# Patient Record
Sex: Male | Born: 1965 | Race: Black or African American | Hispanic: No | Marital: Married | Smoking: Never smoker
Health system: Southern US, Community
[De-identification: ages and names within clinical notes are randomized; demographics above are authoritative.]

## PROBLEM LIST (undated history)

## (undated) DIAGNOSIS — N189 Chronic kidney disease, unspecified: Secondary | ICD-10-CM

## (undated) DIAGNOSIS — M3214 Glomerular disease in systemic lupus erythematosus: Secondary | ICD-10-CM

## (undated) DIAGNOSIS — N186 End stage renal disease: Secondary | ICD-10-CM

## (undated) DIAGNOSIS — E785 Hyperlipidemia, unspecified: Secondary | ICD-10-CM

## (undated) DIAGNOSIS — D649 Anemia, unspecified: Secondary | ICD-10-CM

## (undated) DIAGNOSIS — M109 Gout, unspecified: Secondary | ICD-10-CM

## (undated) DIAGNOSIS — E875 Hyperkalemia: Secondary | ICD-10-CM

## (undated) DIAGNOSIS — D693 Immune thrombocytopenic purpura: Secondary | ICD-10-CM

## (undated) DIAGNOSIS — I10 Essential (primary) hypertension: Secondary | ICD-10-CM

## (undated) DIAGNOSIS — M329 Systemic lupus erythematosus, unspecified: Secondary | ICD-10-CM

## (undated) HISTORY — DX: Anemia, unspecified: D64.9

## (undated) HISTORY — DX: End stage renal disease: N18.6

## (undated) HISTORY — DX: Hyperlipidemia, unspecified: E78.5

## (undated) HISTORY — DX: Immune thrombocytopenic purpura: D69.3

## (undated) HISTORY — DX: Hyperkalemia: E87.5

## (undated) HISTORY — DX: Chronic kidney disease, unspecified: N18.9

## (undated) HISTORY — DX: Systemic lupus erythematosus, unspecified: M32.9

## (undated) HISTORY — DX: Essential (primary) hypertension: I10

## (undated) HISTORY — DX: Glomerular disease in systemic lupus erythematosus: M32.14

## (undated) HISTORY — PX: PARATHYROIDECTOMY: SHX19

## (undated) HISTORY — DX: Gout, unspecified: M10.9

---

## 1999-05-13 ENCOUNTER — Encounter: Payer: Self-pay | Admitting: Nephrology

## 1999-05-13 ENCOUNTER — Ambulatory Visit (HOSPITAL_COMMUNITY): Admission: RE | Admit: 1999-05-13 | Discharge: 1999-05-14 | Payer: Self-pay | Admitting: Nephrology

## 1999-05-15 ENCOUNTER — Emergency Department (HOSPITAL_COMMUNITY): Admission: EM | Admit: 1999-05-15 | Discharge: 1999-05-15 | Payer: Self-pay | Admitting: Emergency Medicine

## 1999-12-20 ENCOUNTER — Observation Stay (HOSPITAL_COMMUNITY): Admission: AD | Admit: 1999-12-20 | Discharge: 1999-12-21 | Payer: Self-pay

## 1999-12-30 ENCOUNTER — Ambulatory Visit (HOSPITAL_COMMUNITY): Admission: RE | Admit: 1999-12-30 | Discharge: 1999-12-30 | Payer: Self-pay | Admitting: *Deleted

## 2000-01-30 ENCOUNTER — Encounter (HOSPITAL_COMMUNITY): Admission: RE | Admit: 2000-01-30 | Discharge: 2000-04-29 | Payer: Self-pay | Admitting: *Deleted

## 2000-02-14 ENCOUNTER — Encounter: Admission: RE | Admit: 2000-02-14 | Discharge: 2000-02-14 | Payer: Self-pay | Admitting: Internal Medicine

## 2000-02-17 ENCOUNTER — Encounter: Admission: RE | Admit: 2000-02-17 | Discharge: 2000-02-17 | Payer: Self-pay | Admitting: Internal Medicine

## 2000-02-17 ENCOUNTER — Encounter: Payer: Self-pay | Admitting: Internal Medicine

## 2000-04-06 ENCOUNTER — Encounter (HOSPITAL_COMMUNITY): Admission: RE | Admit: 2000-04-06 | Discharge: 2000-07-05 | Payer: Self-pay | Admitting: Nephrology

## 2000-07-06 ENCOUNTER — Ambulatory Visit (HOSPITAL_COMMUNITY): Admission: RE | Admit: 2000-07-06 | Discharge: 2000-07-06 | Payer: Self-pay | Admitting: *Deleted

## 2000-07-06 ENCOUNTER — Encounter (INDEPENDENT_AMBULATORY_CARE_PROVIDER_SITE_OTHER): Payer: Self-pay | Admitting: *Deleted

## 2000-08-10 ENCOUNTER — Ambulatory Visit (HOSPITAL_COMMUNITY): Admission: RE | Admit: 2000-08-10 | Discharge: 2000-08-11 | Payer: Self-pay | Admitting: Nephrology

## 2000-08-10 ENCOUNTER — Encounter (INDEPENDENT_AMBULATORY_CARE_PROVIDER_SITE_OTHER): Payer: Self-pay | Admitting: *Deleted

## 2000-08-10 ENCOUNTER — Encounter: Payer: Self-pay | Admitting: Nephrology

## 2000-08-17 ENCOUNTER — Encounter (HOSPITAL_COMMUNITY): Admission: RE | Admit: 2000-08-17 | Discharge: 2000-11-15 | Payer: Self-pay | Admitting: Nephrology

## 2000-09-28 ENCOUNTER — Encounter: Payer: Self-pay | Admitting: *Deleted

## 2000-09-28 ENCOUNTER — Ambulatory Visit (HOSPITAL_COMMUNITY): Admission: RE | Admit: 2000-09-28 | Discharge: 2000-09-28 | Payer: Self-pay | Admitting: *Deleted

## 2002-05-09 ENCOUNTER — Ambulatory Visit (HOSPITAL_COMMUNITY): Admission: RE | Admit: 2002-05-09 | Discharge: 2002-05-09 | Payer: Self-pay | Admitting: Emergency Medicine

## 2002-05-09 ENCOUNTER — Encounter: Payer: Self-pay | Admitting: Emergency Medicine

## 2004-09-26 ENCOUNTER — Ambulatory Visit: Payer: Self-pay | Admitting: Hematology & Oncology

## 2004-12-25 ENCOUNTER — Ambulatory Visit: Payer: Self-pay | Admitting: Hematology & Oncology

## 2005-01-17 ENCOUNTER — Ambulatory Visit: Payer: Self-pay | Admitting: Internal Medicine

## 2005-01-27 ENCOUNTER — Ambulatory Visit: Payer: Self-pay | Admitting: Internal Medicine

## 2005-03-26 ENCOUNTER — Ambulatory Visit: Payer: Self-pay | Admitting: Hematology & Oncology

## 2005-07-04 ENCOUNTER — Ambulatory Visit: Payer: Self-pay | Admitting: Hematology & Oncology

## 2005-10-09 ENCOUNTER — Emergency Department (HOSPITAL_COMMUNITY): Admission: EM | Admit: 2005-10-09 | Discharge: 2005-10-09 | Payer: Self-pay | Admitting: Family Medicine

## 2006-01-23 ENCOUNTER — Ambulatory Visit: Payer: Self-pay | Admitting: Hematology & Oncology

## 2006-01-29 ENCOUNTER — Ambulatory Visit: Payer: Self-pay | Admitting: Internal Medicine

## 2006-02-06 ENCOUNTER — Ambulatory Visit: Payer: Self-pay | Admitting: Internal Medicine

## 2006-05-01 ENCOUNTER — Ambulatory Visit: Payer: Self-pay | Admitting: Internal Medicine

## 2006-08-26 ENCOUNTER — Ambulatory Visit: Payer: Self-pay | Admitting: Hematology & Oncology

## 2006-08-28 LAB — CBC WITH DIFFERENTIAL/PLATELET
BASO%: 0.3 % (ref 0.0–2.0)
EOS%: 5.9 % (ref 0.0–7.0)
LYMPH%: 21.9 % (ref 14.0–48.0)
MCHC: 33.6 g/dL (ref 32.0–35.9)
MONO#: 0.6 10*3/uL (ref 0.1–0.9)
Platelets: 262 10*3/uL (ref 145–400)
RBC: 3.74 10*6/uL — ABNORMAL LOW (ref 4.20–5.71)
WBC: 5.6 10*3/uL (ref 4.0–10.0)

## 2006-08-28 LAB — COMPREHENSIVE METABOLIC PANEL
ALT: 10 U/L (ref 0–53)
AST: 13 U/L (ref 0–37)
Alkaline Phosphatase: 79 U/L (ref 39–117)
Calcium: 9 mg/dL (ref 8.4–10.5)
Chloride: 110 mEq/L (ref 96–112)
Creatinine, Ser: 1.92 mg/dL — ABNORMAL HIGH (ref 0.40–1.50)
Total Bilirubin: 0.4 mg/dL (ref 0.3–1.2)

## 2006-08-28 LAB — CHCC SMEAR

## 2006-09-30 ENCOUNTER — Ambulatory Visit: Payer: Self-pay | Admitting: Hematology & Oncology

## 2006-09-30 LAB — CBC WITH DIFFERENTIAL/PLATELET
BASO%: 0.1 % (ref 0.0–2.0)
LYMPH%: 20 % (ref 14.0–48.0)
MCHC: 32.8 g/dL (ref 32.0–35.9)
MCV: 87.9 fL (ref 81.6–98.0)
MONO%: 11.2 % (ref 0.0–13.0)
Platelets: 260 10*3/uL (ref 145–400)
RBC: 4.12 10*6/uL — ABNORMAL LOW (ref 4.20–5.71)

## 2006-11-04 ENCOUNTER — Ambulatory Visit: Payer: Self-pay | Admitting: Hematology & Oncology

## 2006-11-04 LAB — CBC WITH DIFFERENTIAL/PLATELET
BASO%: 1.5 % (ref 0.0–2.0)
Basophils Absolute: 0.1 10*3/uL (ref 0.0–0.1)
EOS%: 6.3 % (ref 0.0–7.0)
HCT: 39 % (ref 38.7–49.9)
LYMPH%: 19.6 % (ref 14.0–48.0)
MCH: 28.3 pg (ref 28.0–33.4)
MCHC: 32.2 g/dL (ref 32.0–35.9)
MCV: 88 fL (ref 81.6–98.0)
MONO%: 10.7 % (ref 0.0–13.0)
NEUT%: 62 % (ref 40.0–75.0)
Platelets: 241 10*3/uL (ref 145–400)

## 2006-12-02 LAB — CBC WITH DIFFERENTIAL/PLATELET
BASO%: 0.3 % (ref 0.0–2.0)
Basophils Absolute: 0 10*3/uL (ref 0.0–0.1)
EOS%: 4.5 % (ref 0.0–7.0)
Eosinophils Absolute: 0.3 10*3/uL (ref 0.0–0.5)
HCT: 34.5 % — ABNORMAL LOW (ref 38.7–49.9)
HGB: 11.7 g/dL — ABNORMAL LOW (ref 13.0–17.1)
LYMPH%: 20.2 % (ref 14.0–48.0)
MCH: 28.9 pg (ref 28.0–33.4)
MCHC: 33.8 g/dL (ref 32.0–35.9)
MCV: 85.4 fL (ref 81.6–98.0)
MONO#: 0.7 10*3/uL (ref 0.1–0.9)
MONO%: 10.7 % (ref 0.0–13.0)
NEUT#: 4 10*3/uL (ref 1.5–6.5)
NEUT%: 64.3 % (ref 40.0–75.0)
Platelets: 189 10*3/uL (ref 145–400)
RBC: 4.04 10*6/uL — ABNORMAL LOW (ref 4.20–5.71)
RDW: 15.2 % — ABNORMAL HIGH (ref 11.2–14.6)
WBC: 6.3 10*3/uL (ref 4.0–10.0)
lymph#: 1.3 10*3/uL (ref 0.9–3.3)

## 2006-12-25 ENCOUNTER — Ambulatory Visit: Payer: Self-pay | Admitting: Hematology & Oncology

## 2006-12-30 LAB — CBC WITH DIFFERENTIAL/PLATELET
Basophils Absolute: 0 10*3/uL (ref 0.0–0.1)
EOS%: 2.7 % (ref 0.0–7.0)
HCT: 34.3 % — ABNORMAL LOW (ref 38.7–49.9)
HGB: 11.3 g/dL — ABNORMAL LOW (ref 13.0–17.1)
MCH: 28.3 pg (ref 28.0–33.4)
MCV: 85.4 fL (ref 81.6–98.0)
MONO%: 11.8 % (ref 0.0–13.0)
NEUT%: 61 % (ref 40.0–75.0)

## 2007-01-27 LAB — CBC WITH DIFFERENTIAL/PLATELET
Basophils Absolute: 0 10*3/uL (ref 0.0–0.1)
EOS%: 3.7 % (ref 0.0–7.0)
MCH: 28.7 pg (ref 28.0–33.4)
MCV: 85.8 fL (ref 81.6–98.0)
MONO%: 10.7 % (ref 0.0–13.0)
RBC: 4.18 10*6/uL — ABNORMAL LOW (ref 4.20–5.71)
RDW: 15.3 % — ABNORMAL HIGH (ref 11.2–14.6)

## 2007-02-19 ENCOUNTER — Ambulatory Visit: Payer: Self-pay | Admitting: Hematology & Oncology

## 2007-02-24 LAB — CBC WITH DIFFERENTIAL/PLATELET
BASO%: 1 % (ref 0.0–2.0)
Eosinophils Absolute: 0.2 10*3/uL (ref 0.0–0.5)
MCHC: 32.5 g/dL (ref 32.0–35.9)
MCV: 87.8 fL (ref 81.6–98.0)
MONO%: 10.2 % (ref 0.0–13.0)
NEUT#: 4 10*3/uL (ref 1.5–6.5)
RBC: 3.95 10*6/uL — ABNORMAL LOW (ref 4.20–5.71)
RDW: 13.3 % (ref 11.2–14.6)
WBC: 6 10*3/uL (ref 4.0–10.0)

## 2007-03-24 LAB — CBC WITH DIFFERENTIAL/PLATELET
BASO%: 0.3 % (ref 0.0–2.0)
Eosinophils Absolute: 0.4 10*3/uL (ref 0.0–0.5)
LYMPH%: 15.1 % (ref 14.0–48.0)
MONO#: 0.8 10*3/uL (ref 0.1–0.9)
NEUT#: 5.1 10*3/uL (ref 1.5–6.5)
Platelets: 243 10*3/uL (ref 145–400)
RBC: 4 10*6/uL — ABNORMAL LOW (ref 4.20–5.71)
RDW: 14.4 % (ref 11.2–14.6)
WBC: 7.5 10*3/uL (ref 4.0–10.0)
lymph#: 1.1 10*3/uL (ref 0.9–3.3)

## 2007-03-26 LAB — FERRITIN: Ferritin: 337 ng/mL — ABNORMAL HIGH (ref 22–322)

## 2007-03-26 LAB — TRANSFERRIN RECEPTOR, SOLUABLE: Transferrin Receptor, Soluble: 46.3 nmol/L

## 2007-04-01 ENCOUNTER — Emergency Department (HOSPITAL_COMMUNITY): Admission: EM | Admit: 2007-04-01 | Discharge: 2007-04-01 | Payer: Self-pay | Admitting: Emergency Medicine

## 2007-04-16 ENCOUNTER — Ambulatory Visit: Payer: Self-pay | Admitting: Hematology & Oncology

## 2007-04-22 LAB — CBC WITH DIFFERENTIAL/PLATELET
BASO%: 0.4 % (ref 0.0–2.0)
MCHC: 34.1 g/dL (ref 32.0–35.9)
MONO#: 0.8 10*3/uL (ref 0.1–0.9)
RBC: 3.65 10*6/uL — ABNORMAL LOW (ref 4.20–5.71)
RDW: 14.6 % (ref 11.2–14.6)
WBC: 6.2 10*3/uL (ref 4.0–10.0)
lymph#: 1.4 10*3/uL (ref 0.9–3.3)

## 2007-04-28 ENCOUNTER — Ambulatory Visit: Payer: Self-pay | Admitting: Internal Medicine

## 2007-04-28 LAB — CONVERTED CEMR LAB
ALT: 22 units/L (ref 0–53)
AST: 16 units/L (ref 0–37)
Albumin: 3.7 g/dL (ref 3.5–5.2)
Alkaline Phosphatase: 57 units/L (ref 39–117)
BUN: 50 mg/dL — ABNORMAL HIGH (ref 6–23)
Basophils Absolute: 0 10*3/uL (ref 0.0–0.1)
Basophils Relative: 0 % (ref 0.0–1.0)
Bilirubin Urine: NEGATIVE
Bilirubin, Direct: 0.1 mg/dL (ref 0.0–0.3)
CO2: 21 meq/L (ref 19–32)
Calcium: 9.1 mg/dL (ref 8.4–10.5)
Chloride: 118 meq/L — ABNORMAL HIGH (ref 96–112)
Cholesterol: 231 mg/dL (ref 0–200)
Creatinine, Ser: 2.3 mg/dL — ABNORMAL HIGH (ref 0.4–1.5)
Direct LDL: 122.3 mg/dL
Eosinophils Absolute: 0.5 10*3/uL (ref 0.0–0.6)
Eosinophils Relative: 5.5 % — ABNORMAL HIGH (ref 0.0–5.0)
GFR calc Af Amer: 41 mL/min
GFR calc non Af Amer: 34 mL/min
Glucose, Bld: 99 mg/dL (ref 70–99)
HCT: 34.3 % — ABNORMAL LOW (ref 39.0–52.0)
HDL: 37.4 mg/dL — ABNORMAL LOW (ref >39.0)
Hemoglobin: 11.2 g/dL — ABNORMAL LOW (ref 13.0–17.0)
Ketones, ur: NEGATIVE mg/dL
Leukocytes, UA: NEGATIVE
Lymphocytes Relative: 16.4 % (ref 12.0–46.0)
MCHC: 32.7 g/dL (ref 30.0–36.0)
MCV: 86.4 fL (ref 78.0–100.0)
Monocytes Absolute: 0.9 10*3/uL — ABNORMAL HIGH (ref 0.2–0.7)
Monocytes Relative: 10.8 % (ref 3.0–11.0)
Mucus, UA: NEGATIVE
Neutro Abs: 5.9 10*3/uL (ref 1.4–7.7)
Neutrophils Relative %: 67.3 % (ref 43.0–77.0)
Nitrite: NEGATIVE
PSA: 1.04 ng/mL (ref 0.10–4.00)
Platelets: 215 10*3/uL (ref 150–400)
Potassium: 5.4 meq/L — ABNORMAL HIGH (ref 3.5–5.1)
RBC: 3.96 M/uL — ABNORMAL LOW (ref 4.22–5.81)
RDW: 14.9 % — ABNORMAL HIGH (ref 11.5–14.6)
Sodium: 142 meq/L (ref 135–145)
Specific Gravity, Urine: 1.025 (ref 1.000–1.03)
Squamous Epithelial / HPF: NEGATIVE /lpf
TSH: 2.18 microintl units/mL (ref 0.35–5.50)
Total Bilirubin: 0.9 mg/dL (ref 0.3–1.2)
Total CHOL/HDL Ratio: 6.2
Total Protein, Urine: 100 mg/dL — AB
Total Protein: 7.2 g/dL (ref 6.0–8.3)
Triglycerides: 238 mg/dL (ref 0–149)
Urine Glucose: NEGATIVE mg/dL
Urobilinogen, UA: 0.2 (ref 0.0–1.0)
VLDL: 48 mg/dL — ABNORMAL HIGH (ref 0–40)
WBC: 8.7 10*3/uL (ref 4.5–10.5)
pH: 6 (ref 5.0–8.0)

## 2007-05-05 ENCOUNTER — Ambulatory Visit: Payer: Self-pay | Admitting: Internal Medicine

## 2007-06-11 ENCOUNTER — Ambulatory Visit: Payer: Self-pay | Admitting: Hematology & Oncology

## 2007-06-16 LAB — CBC WITH DIFFERENTIAL/PLATELET
BASO%: 0.9 % (ref 0.0–2.0)
Basophils Absolute: 0.1 10*3/uL (ref 0.0–0.1)
HCT: 31.9 % — ABNORMAL LOW (ref 38.7–49.9)
HGB: 10.6 g/dL — ABNORMAL LOW (ref 13.0–17.1)
MCHC: 33.3 g/dL (ref 32.0–35.9)
MONO#: 1.1 10*3/uL — ABNORMAL HIGH (ref 0.1–0.9)
NEUT%: 71.1 % (ref 40.0–75.0)
WBC: 11 10*3/uL — ABNORMAL HIGH (ref 4.0–10.0)
lymph#: 1.5 10*3/uL (ref 0.9–3.3)

## 2007-07-13 LAB — CBC WITH DIFFERENTIAL/PLATELET
Basophils Absolute: 0 10*3/uL (ref 0.0–0.1)
EOS%: 8.3 % — ABNORMAL HIGH (ref 0.0–7.0)
Eosinophils Absolute: 0.4 10*3/uL (ref 0.0–0.5)
HCT: 32 % — ABNORMAL LOW (ref 38.7–49.9)
HGB: 10.8 g/dL — ABNORMAL LOW (ref 13.0–17.1)
MCH: 29.5 pg (ref 28.0–33.4)
NEUT%: 58.4 % (ref 40.0–75.0)
lymph#: 1.1 10*3/uL (ref 0.9–3.3)

## 2007-08-06 ENCOUNTER — Ambulatory Visit: Payer: Self-pay | Admitting: Hematology & Oncology

## 2007-08-10 LAB — CBC WITH DIFFERENTIAL/PLATELET
Basophils Absolute: 0.1 10*3/uL (ref 0.0–0.1)
EOS%: 5.1 % (ref 0.0–7.0)
HCT: 32.8 % — ABNORMAL LOW (ref 38.7–49.9)
HGB: 10.7 g/dL — ABNORMAL LOW (ref 13.0–17.1)
LYMPH%: 20.6 % (ref 14.0–48.0)
MCH: 29 pg (ref 28.0–33.4)
MCV: 89.3 fL (ref 81.6–98.0)
MONO%: 9 % (ref 0.0–13.0)
NEUT%: 64 % (ref 40.0–75.0)
Platelets: 181 10*3/uL (ref 145–400)

## 2007-09-26 ENCOUNTER — Emergency Department (HOSPITAL_COMMUNITY): Admission: EM | Admit: 2007-09-26 | Discharge: 2007-09-26 | Payer: Self-pay | Admitting: Family Medicine

## 2007-10-14 ENCOUNTER — Ambulatory Visit: Payer: Self-pay | Admitting: Hematology & Oncology

## 2007-10-18 ENCOUNTER — Encounter: Payer: Self-pay | Admitting: Internal Medicine

## 2007-10-18 LAB — CBC & DIFF AND RETIC
BASO%: 1.6 % (ref 0.0–2.0)
Basophils Absolute: 0.1 10*3/uL (ref 0.0–0.1)
EOS%: 3.2 % (ref 0.0–7.0)
HCT: 32.2 % — ABNORMAL LOW (ref 38.7–49.9)
HGB: 10.6 g/dL — ABNORMAL LOW (ref 13.0–17.1)
IRF: 0.32 (ref 0.070–0.380)
LYMPH%: 18.7 % (ref 14.0–48.0)
MCH: 29.2 pg (ref 28.0–33.4)
MCHC: 32.8 g/dL (ref 32.0–35.9)
NEUT%: 68.6 % (ref 40.0–75.0)
Platelets: 215 10*3/uL (ref 145–400)
lymph#: 1.4 10*3/uL (ref 0.9–3.3)

## 2007-10-18 LAB — FERRITIN: Ferritin: 325 ng/mL — ABNORMAL HIGH (ref 22–322)

## 2007-11-22 ENCOUNTER — Encounter: Payer: Self-pay | Admitting: Internal Medicine

## 2007-11-23 LAB — CBC WITH DIFFERENTIAL/PLATELET
Basophils Absolute: 0 10*3/uL (ref 0.0–0.1)
EOS%: 3 % (ref 0.0–7.0)
Eosinophils Absolute: 0.2 10*3/uL (ref 0.0–0.5)
HCT: 34.7 % — ABNORMAL LOW (ref 38.7–49.9)
HGB: 11.3 g/dL — ABNORMAL LOW (ref 13.0–17.1)
MCH: 29.5 pg (ref 28.0–33.4)
MCV: 90.5 fL (ref 81.6–98.0)
NEUT#: 5.4 10*3/uL (ref 1.5–6.5)
NEUT%: 68.8 % (ref 40.0–75.0)
RDW: 16 % — ABNORMAL HIGH (ref 11.2–14.6)
lymph#: 1.4 10*3/uL (ref 0.9–3.3)

## 2007-12-16 ENCOUNTER — Ambulatory Visit: Payer: Self-pay | Admitting: Hematology & Oncology

## 2007-12-21 LAB — CBC WITH DIFFERENTIAL/PLATELET
Eosinophils Absolute: 0.3 10*3/uL (ref 0.0–0.5)
LYMPH%: 8.8 % — ABNORMAL LOW (ref 14.0–48.0)
MONO#: 1 10*3/uL — ABNORMAL HIGH (ref 0.1–0.9)
NEUT#: 9.3 10*3/uL — ABNORMAL HIGH (ref 1.5–6.5)
Platelets: 172 10*3/uL (ref 145–400)
RBC: 4.13 10*6/uL — ABNORMAL LOW (ref 4.20–5.71)
WBC: 11.8 10*3/uL — ABNORMAL HIGH (ref 4.0–10.0)
lymph#: 1 10*3/uL (ref 0.9–3.3)

## 2008-02-21 ENCOUNTER — Ambulatory Visit: Payer: Self-pay | Admitting: Hematology & Oncology

## 2008-02-22 ENCOUNTER — Encounter: Payer: Self-pay | Admitting: *Deleted

## 2008-02-22 DIAGNOSIS — M329 Systemic lupus erythematosus, unspecified: Secondary | ICD-10-CM | POA: Insufficient documentation

## 2008-02-22 DIAGNOSIS — M109 Gout, unspecified: Secondary | ICD-10-CM | POA: Insufficient documentation

## 2008-02-22 DIAGNOSIS — E785 Hyperlipidemia, unspecified: Secondary | ICD-10-CM | POA: Insufficient documentation

## 2008-02-22 DIAGNOSIS — I129 Hypertensive chronic kidney disease with stage 1 through stage 4 chronic kidney disease, or unspecified chronic kidney disease: Secondary | ICD-10-CM | POA: Insufficient documentation

## 2008-02-22 DIAGNOSIS — N189 Chronic kidney disease, unspecified: Secondary | ICD-10-CM | POA: Insufficient documentation

## 2008-02-22 DIAGNOSIS — E875 Hyperkalemia: Secondary | ICD-10-CM | POA: Insufficient documentation

## 2008-02-23 ENCOUNTER — Encounter: Payer: Self-pay | Admitting: Internal Medicine

## 2008-02-23 LAB — CBC WITH DIFFERENTIAL/PLATELET
Basophils Absolute: 0.1 10*3/uL (ref 0.0–0.1)
EOS%: 5.5 % (ref 0.0–7.0)
Eosinophils Absolute: 0.4 10*3/uL (ref 0.0–0.5)
HGB: 11.6 g/dL — ABNORMAL LOW (ref 13.0–17.1)
MCH: 30 pg (ref 28.0–33.4)
NEUT#: 4.9 10*3/uL (ref 1.5–6.5)
RDW: 16.6 % — ABNORMAL HIGH (ref 11.2–14.6)
lymph#: 1.4 10*3/uL (ref 0.9–3.3)

## 2008-02-26 DIAGNOSIS — D649 Anemia, unspecified: Secondary | ICD-10-CM | POA: Insufficient documentation

## 2008-02-26 DIAGNOSIS — D693 Immune thrombocytopenic purpura: Secondary | ICD-10-CM | POA: Insufficient documentation

## 2008-03-23 ENCOUNTER — Ambulatory Visit: Payer: Self-pay | Admitting: Hematology & Oncology

## 2008-03-23 LAB — CBC WITH DIFFERENTIAL/PLATELET
BASO%: 0.2 % (ref 0.0–2.0)
EOS%: 8.5 % — ABNORMAL HIGH (ref 0.0–7.0)
MCH: 30.2 pg (ref 28.0–33.4)
MCHC: 33.7 g/dL (ref 32.0–35.9)
RBC: 3.46 10*6/uL — ABNORMAL LOW (ref 4.20–5.71)
RDW: 16.6 % — ABNORMAL HIGH (ref 11.2–14.6)
lymph#: 1.3 10*3/uL (ref 0.9–3.3)

## 2008-04-02 ENCOUNTER — Emergency Department (HOSPITAL_COMMUNITY): Admission: EM | Admit: 2008-04-02 | Discharge: 2008-04-02 | Payer: Self-pay | Admitting: Family Medicine

## 2008-04-13 ENCOUNTER — Encounter: Payer: Self-pay | Admitting: Internal Medicine

## 2008-04-24 ENCOUNTER — Encounter: Payer: Self-pay | Admitting: Internal Medicine

## 2008-04-24 LAB — FERRITIN: Ferritin: 1010 ng/mL — ABNORMAL HIGH (ref 22–322)

## 2008-04-24 LAB — CHCC SMEAR

## 2008-04-24 LAB — CBC WITH DIFFERENTIAL/PLATELET
Eosinophils Absolute: 0.5 10*3/uL (ref 0.0–0.5)
HCT: 31.3 % — ABNORMAL LOW (ref 38.7–49.9)
LYMPH%: 20.7 % (ref 14.0–48.0)
MONO#: 0.8 10*3/uL (ref 0.1–0.9)
NEUT#: 3.9 10*3/uL (ref 1.5–6.5)
NEUT%: 58.7 % (ref 40.0–75.0)
Platelets: 158 10*3/uL (ref 145–400)
RBC: 3.46 10*6/uL — ABNORMAL LOW (ref 4.20–5.71)
WBC: 6.6 10*3/uL (ref 4.0–10.0)

## 2008-05-02 ENCOUNTER — Ambulatory Visit: Payer: Self-pay | Admitting: Hematology & Oncology

## 2008-05-05 LAB — CBC WITH DIFFERENTIAL/PLATELET
BASO%: 0.5 % (ref 0.0–2.0)
HCT: 30.4 % — ABNORMAL LOW (ref 38.7–49.9)
MCHC: 33.6 g/dL (ref 32.0–35.9)
MONO#: 1 10*3/uL — ABNORMAL HIGH (ref 0.1–0.9)
RBC: 3.34 10*6/uL — ABNORMAL LOW (ref 4.20–5.71)
WBC: 9.4 10*3/uL (ref 4.0–10.0)
lymph#: 2 10*3/uL (ref 0.9–3.3)

## 2008-05-05 LAB — CHCC SMEAR

## 2008-05-08 ENCOUNTER — Ambulatory Visit: Payer: Self-pay | Admitting: Internal Medicine

## 2008-05-09 ENCOUNTER — Ambulatory Visit: Payer: Self-pay | Admitting: Internal Medicine

## 2008-05-09 LAB — CONVERTED CEMR LAB
ALT: 31 units/L (ref 0–53)
AST: 20 units/L (ref 0–37)
Albumin: 3.7 g/dL (ref 3.5–5.2)
Alkaline Phosphatase: 68 units/L (ref 39–117)
BUN: 55 mg/dL — ABNORMAL HIGH (ref 6–23)
Basophils Absolute: 0 10*3/uL (ref 0.0–0.1)
Basophils Relative: 0.1 % (ref 0.0–1.0)
Bilirubin Urine: NEGATIVE
Bilirubin, Direct: 0.1 mg/dL (ref 0.0–0.3)
CO2: 20 meq/L (ref 19–32)
Calcium: 9.4 mg/dL (ref 8.4–10.5)
Chloride: 116 meq/L — ABNORMAL HIGH (ref 96–112)
Cholesterol: 115 mg/dL (ref 0–200)
Creatinine, Ser: 2 mg/dL — ABNORMAL HIGH (ref 0.4–1.5)
Crystals: NEGATIVE
Eosinophils Absolute: 0.4 10*3/uL (ref 0.0–0.7)
Eosinophils Relative: 3.6 % (ref 0.0–5.0)
GFR calc Af Amer: 48 mL/min
GFR calc non Af Amer: 39 mL/min
Glucose, Bld: 92 mg/dL (ref 70–99)
HCT: 30.7 % — ABNORMAL LOW (ref 39.0–52.0)
HDL: 35 mg/dL — ABNORMAL LOW (ref >39.0)
Hemoglobin, Urine: NEGATIVE
Hemoglobin: 10.2 g/dL — ABNORMAL LOW (ref 13.0–17.0)
Ketones, ur: NEGATIVE mg/dL
LDL Cholesterol: 61 mg/dL (ref 0–99)
Leukocytes, UA: NEGATIVE
Lymphocytes Relative: 17.3 % (ref 12.0–46.0)
MCHC: 33.2 g/dL (ref 30.0–36.0)
MCV: 92.7 fL (ref 78.0–100.0)
Monocytes Absolute: 1 10*3/uL (ref 0.1–1.0)
Monocytes Relative: 9.7 % (ref 3.0–12.0)
Mucus, UA: NEGATIVE
Neutro Abs: 7 10*3/uL (ref 1.4–7.7)
Neutrophils Relative %: 69.3 % (ref 43.0–77.0)
Nitrite: NEGATIVE
Platelets: 149 10*3/uL — ABNORMAL LOW (ref 150–400)
Potassium: 4.8 meq/L (ref 3.5–5.1)
RBC: 3.31 M/uL — ABNORMAL LOW (ref 4.22–5.81)
RDW: 15.1 % — ABNORMAL HIGH (ref 11.5–14.6)
Sodium: 142 meq/L (ref 135–145)
Specific Gravity, Urine: 1.025 (ref 1.000–1.03)
TSH: 2.29 microintl units/mL (ref 0.35–5.50)
Total Bilirubin: 0.6 mg/dL (ref 0.3–1.2)
Total CHOL/HDL Ratio: 3.3
Total Protein, Urine: 30 mg/dL — AB
Total Protein: 7 g/dL (ref 6.0–8.3)
Triglycerides: 95 mg/dL (ref 0–149)
Urine Glucose: NEGATIVE mg/dL
Urobilinogen, UA: 0.2 (ref 0.0–1.0)
VLDL: 19 mg/dL (ref 0–40)
WBC: 10.1 10*3/uL (ref 4.5–10.5)
pH: 5.5 (ref 5.0–8.0)

## 2008-05-26 LAB — CBC WITH DIFFERENTIAL/PLATELET
Eosinophils Absolute: 0.3 10*3/uL (ref 0.0–0.5)
MONO#: 0.6 10*3/uL (ref 0.1–0.9)
NEUT#: 2.9 10*3/uL (ref 1.5–6.5)
RBC: 3.83 10*6/uL — ABNORMAL LOW (ref 4.20–5.71)
RDW: 14.8 % — ABNORMAL HIGH (ref 11.2–14.6)
WBC: 5.3 10*3/uL (ref 4.0–10.0)

## 2008-06-17 ENCOUNTER — Emergency Department (HOSPITAL_COMMUNITY): Admission: EM | Admit: 2008-06-17 | Discharge: 2008-06-17 | Payer: Self-pay | Admitting: Emergency Medicine

## 2008-07-13 ENCOUNTER — Ambulatory Visit: Payer: Self-pay | Admitting: Hematology & Oncology

## 2008-07-13 ENCOUNTER — Emergency Department (HOSPITAL_COMMUNITY): Admission: EM | Admit: 2008-07-13 | Discharge: 2008-07-13 | Payer: Self-pay | Admitting: Family Medicine

## 2008-07-14 ENCOUNTER — Ambulatory Visit: Payer: Self-pay | Admitting: Hematology & Oncology

## 2008-07-14 LAB — CBC WITH DIFFERENTIAL/PLATELET
Basophils Absolute: 0 10*3/uL (ref 0.0–0.1)
Eosinophils Absolute: 0.2 10*3/uL (ref 0.0–0.5)
HGB: 11 g/dL — ABNORMAL LOW (ref 13.0–17.1)
MCV: 91.2 fL (ref 81.6–98.0)
MONO#: 1.2 10*3/uL — ABNORMAL HIGH (ref 0.1–0.9)
MONO%: 10.1 % (ref 0.0–13.0)
NEUT#: 9.2 10*3/uL — ABNORMAL HIGH (ref 1.5–6.5)
RDW: 16.1 % — ABNORMAL HIGH (ref 11.2–14.6)

## 2008-07-14 LAB — FERRITIN: Ferritin: 697 ng/mL — ABNORMAL HIGH (ref 22–322)

## 2008-09-19 ENCOUNTER — Ambulatory Visit: Payer: Self-pay | Admitting: Hematology & Oncology

## 2008-09-22 LAB — CBC WITH DIFFERENTIAL/PLATELET
Basophils Absolute: 0 10*3/uL (ref 0.0–0.1)
EOS%: 3.7 % (ref 0.0–7.0)
Eosinophils Absolute: 0.3 10*3/uL (ref 0.0–0.5)
HGB: 9.7 g/dL — ABNORMAL LOW (ref 13.0–17.1)
NEUT#: 5.1 10*3/uL (ref 1.5–6.5)
RDW: 16.5 % — ABNORMAL HIGH (ref 11.2–14.6)
lymph#: 1.1 10*3/uL (ref 0.9–3.3)

## 2008-10-13 ENCOUNTER — Ambulatory Visit: Payer: Self-pay | Admitting: Hematology & Oncology

## 2008-10-16 ENCOUNTER — Encounter: Payer: Self-pay | Admitting: Internal Medicine

## 2008-10-17 ENCOUNTER — Encounter: Payer: Self-pay | Admitting: Internal Medicine

## 2008-11-29 ENCOUNTER — Ambulatory Visit: Payer: Self-pay | Admitting: Hematology & Oncology

## 2008-12-01 LAB — CBC WITH DIFFERENTIAL/PLATELET
BASO%: 0.5 % (ref 0.0–2.0)
EOS%: 3.3 % (ref 0.0–7.0)
MCH: 29.8 pg (ref 28.0–33.4)
MCHC: 33.5 g/dL (ref 32.0–35.9)
RDW: 15.8 % — ABNORMAL HIGH (ref 11.2–14.6)
lymph#: 1.3 10*3/uL (ref 0.9–3.3)

## 2008-12-28 LAB — CBC WITH DIFFERENTIAL/PLATELET
BASO%: 0.9 % (ref 0.0–2.0)
EOS%: 4.4 % (ref 0.0–7.0)
Eosinophils Absolute: 0.2 10*3/uL (ref 0.0–0.5)
MCHC: 31.9 g/dL — ABNORMAL LOW (ref 32.0–36.0)
MCV: 88.8 fL (ref 79.3–98.0)
MONO%: 12.9 % (ref 0.0–14.0)
NEUT#: 3 10*3/uL (ref 1.5–6.5)
RBC: 4.27 10*6/uL (ref 4.20–5.82)
RDW: 14.9 % — ABNORMAL HIGH (ref 11.0–14.6)
nRBC: 0 % (ref 0–0)

## 2009-01-24 ENCOUNTER — Emergency Department (HOSPITAL_COMMUNITY): Admission: EM | Admit: 2009-01-24 | Discharge: 2009-01-24 | Payer: Self-pay | Admitting: Family Medicine

## 2009-02-06 ENCOUNTER — Ambulatory Visit: Payer: Self-pay | Admitting: Hematology

## 2009-02-06 LAB — CBC & DIFF AND RETIC
BASO%: 0.5 % (ref 0.0–2.0)
Basophils Absolute: 0 10*3/uL (ref 0.0–0.1)
HCT: 32.6 % — ABNORMAL LOW (ref 38.4–49.9)
HGB: 10.9 g/dL — ABNORMAL LOW (ref 13.0–17.1)
IRF: 0.34 (ref 0.070–0.380)
MCHC: 33.4 g/dL (ref 32.0–36.0)
MONO#: 0.6 10*3/uL (ref 0.1–0.9)
NEUT%: 66.2 % (ref 39.0–75.0)
WBC: 7 10*3/uL (ref 4.0–10.3)
lymph#: 1.4 10*3/uL (ref 0.9–3.3)

## 2009-02-08 LAB — FERRITIN: Ferritin: 741 ng/mL — ABNORMAL HIGH (ref 22–322)

## 2009-03-01 ENCOUNTER — Ambulatory Visit: Payer: Self-pay | Admitting: Hematology & Oncology

## 2009-03-02 ENCOUNTER — Encounter: Payer: Self-pay | Admitting: Internal Medicine

## 2009-03-02 LAB — CBC WITH DIFFERENTIAL (CANCER CENTER ONLY)
EOS%: 4.5 % (ref 0.0–7.0)
Eosinophils Absolute: 0.3 10*3/uL (ref 0.0–0.5)
LYMPH%: 24.8 % (ref 14.0–48.0)
MCH: 28.7 pg (ref 28.0–33.4)
MCHC: 32.8 g/dL (ref 32.0–35.9)
MCV: 87 fL (ref 82–98)
MONO%: 7.9 % (ref 0.0–13.0)
NEUT#: 4.1 10*3/uL (ref 1.5–6.5)
Platelets: 159 10*3/uL (ref 145–400)
RDW: 14.8 % — ABNORMAL HIGH (ref 10.5–14.6)

## 2009-03-28 ENCOUNTER — Ambulatory Visit: Payer: Self-pay | Admitting: Hematology

## 2009-03-30 LAB — CBC WITH DIFFERENTIAL/PLATELET
BASO%: 0.2 % (ref 0.0–2.0)
LYMPH%: 19 % (ref 14.0–49.0)
MCHC: 32.7 g/dL (ref 32.0–36.0)
MCV: 90.8 fL (ref 79.3–98.0)
MONO%: 10.9 % (ref 0.0–14.0)
Platelets: 155 10*3/uL (ref 140–400)
RBC: 3.51 10*6/uL — ABNORMAL LOW (ref 4.20–5.82)
WBC: 7.9 10*3/uL (ref 4.0–10.3)

## 2009-04-23 LAB — CBC WITH DIFFERENTIAL/PLATELET
BASO%: 0.9 % (ref 0.0–2.0)
MCHC: 31.5 g/dL — ABNORMAL LOW (ref 32.0–36.0)
MONO#: 0.8 10*3/uL (ref 0.1–0.9)
RBC: 3.75 10*6/uL — ABNORMAL LOW (ref 4.20–5.82)
RDW: 15 % — ABNORMAL HIGH (ref 11.0–14.6)
WBC: 5.6 10*3/uL (ref 4.0–10.3)
lymph#: 1.5 10*3/uL (ref 0.9–3.3)

## 2009-05-14 ENCOUNTER — Ambulatory Visit: Payer: Self-pay | Admitting: Internal Medicine

## 2009-05-14 LAB — CONVERTED CEMR LAB
ALT: 41 units/L (ref 0–53)
AST: 33 units/L (ref 0–37)
Albumin: 3.7 g/dL (ref 3.5–5.2)
Alkaline Phosphatase: 67 units/L (ref 39–117)
BUN: 45 mg/dL — ABNORMAL HIGH (ref 6–23)
Basophils Absolute: 0 10*3/uL (ref 0.0–0.1)
Basophils Relative: 0.8 % (ref 0.0–3.0)
Bilirubin Urine: NEGATIVE
Bilirubin, Direct: 0.1 mg/dL (ref 0.0–0.3)
CO2: 22 meq/L (ref 19–32)
Calcium: 9 mg/dL (ref 8.4–10.5)
Chloride: 113 meq/L — ABNORMAL HIGH (ref 96–112)
Cholesterol: 137 mg/dL (ref 0–200)
Creatinine, Ser: 1.9 mg/dL — ABNORMAL HIGH (ref 0.4–1.5)
Eosinophils Absolute: 0.2 10*3/uL (ref 0.0–0.7)
Eosinophils Relative: 3 % (ref 0.0–5.0)
GFR calc non Af Amer: 50.2 mL/min (ref >60)
Glucose, Bld: 93 mg/dL (ref 70–99)
HCT: 34.7 % — ABNORMAL LOW (ref 39.0–52.0)
HDL: 42.8 mg/dL (ref >39.00)
Hemoglobin, Urine: NEGATIVE
Hemoglobin: 11.6 g/dL — ABNORMAL LOW (ref 13.0–17.0)
Ketones, ur: NEGATIVE mg/dL
LDL Cholesterol: 77 mg/dL (ref 0–99)
Leukocytes, UA: NEGATIVE
Lymphocytes Relative: 21 % (ref 12.0–46.0)
Lymphs Abs: 1.2 10*3/uL (ref 0.7–4.0)
MCHC: 33.3 g/dL (ref 30.0–36.0)
MCV: 89.7 fL (ref 78.0–100.0)
Monocytes Absolute: 0.7 10*3/uL (ref 0.1–1.0)
Monocytes Relative: 11.8 % (ref 3.0–12.0)
Neutro Abs: 3.7 10*3/uL (ref 1.4–7.7)
Neutrophils Relative %: 63.4 % (ref 43.0–77.0)
Nitrite: NEGATIVE
Platelets: 147 10*3/uL — ABNORMAL LOW (ref 150.0–400.0)
Potassium: 4.3 meq/L (ref 3.5–5.1)
RBC: 3.87 M/uL — ABNORMAL LOW (ref 4.22–5.81)
RDW: 14.8 % — ABNORMAL HIGH (ref 11.5–14.6)
Sodium: 140 meq/L (ref 135–145)
Specific Gravity, Urine: >=1.03 (ref 1.000–1.030)
TSH: 2.51 microintl units/mL (ref 0.35–5.50)
Total Bilirubin: 0.7 mg/dL (ref 0.3–1.2)
Total CHOL/HDL Ratio: 3
Total Protein, Urine: 100 mg/dL
Total Protein: 7.5 g/dL (ref 6.0–8.3)
Triglycerides: 85 mg/dL (ref 0.0–149.0)
Urine Glucose: NEGATIVE mg/dL
Urobilinogen, UA: 0.2 (ref 0.0–1.0)
VLDL: 17 mg/dL (ref 0.0–40.0)
WBC: 5.8 10*3/uL (ref 4.5–10.5)
pH: 5 (ref 5.0–8.0)

## 2009-05-15 ENCOUNTER — Ambulatory Visit: Payer: Self-pay | Admitting: Internal Medicine

## 2009-06-01 ENCOUNTER — Ambulatory Visit: Payer: Self-pay | Admitting: Hematology

## 2009-06-05 LAB — CBC WITH DIFFERENTIAL/PLATELET
Eosinophils Absolute: 0.3 10*3/uL (ref 0.0–0.5)
MONO#: 0.8 10*3/uL (ref 0.1–0.9)
NEUT#: 3.9 10*3/uL (ref 1.5–6.5)
RBC: 3.85 10*6/uL — ABNORMAL LOW (ref 4.20–5.82)
RDW: 16.1 % — ABNORMAL HIGH (ref 11.0–14.6)
WBC: 6.3 10*3/uL (ref 4.0–10.3)
lymph#: 1.2 10*3/uL (ref 0.9–3.3)

## 2009-07-18 ENCOUNTER — Ambulatory Visit: Payer: Self-pay | Admitting: Hematology

## 2009-07-18 LAB — CBC WITH DIFFERENTIAL/PLATELET
BASO%: 0.6 % (ref 0.0–2.0)
EOS%: 4.9 % (ref 0.0–7.0)
HGB: 10.8 g/dL — ABNORMAL LOW (ref 13.0–17.1)
MCH: 29.8 pg (ref 27.2–33.4)
MCHC: 33.5 g/dL (ref 32.0–36.0)
MONO#: 0.9 10*3/uL (ref 0.1–0.9)
RDW: 16.9 % — ABNORMAL HIGH (ref 11.0–14.6)
WBC: 7 10*3/uL (ref 4.0–10.3)
lymph#: 1.2 10*3/uL (ref 0.9–3.3)

## 2009-08-07 ENCOUNTER — Telehealth (INDEPENDENT_AMBULATORY_CARE_PROVIDER_SITE_OTHER): Payer: Self-pay | Admitting: *Deleted

## 2009-08-22 ENCOUNTER — Ambulatory Visit: Payer: Self-pay | Admitting: Hematology

## 2009-08-24 LAB — CBC WITH DIFFERENTIAL/PLATELET
BASO%: 0.3 % (ref 0.0–2.0)
EOS%: 3.5 % (ref 0.0–7.0)
HCT: 33.9 % — ABNORMAL LOW (ref 38.4–49.9)
LYMPH%: 15.5 % (ref 14.0–49.0)
MCH: 29.8 pg (ref 27.2–33.4)
MCHC: 32.8 g/dL (ref 32.0–36.0)
NEUT%: 72.1 % (ref 39.0–75.0)
RBC: 3.74 10*6/uL — ABNORMAL LOW (ref 4.20–5.82)
WBC: 8.1 10*3/uL (ref 4.0–10.3)
lymph#: 1.3 10*3/uL (ref 0.9–3.3)

## 2009-09-19 LAB — CBC WITH DIFFERENTIAL/PLATELET
BASO%: 0.6 % (ref 0.0–2.0)
HCT: 34.7 % — ABNORMAL LOW (ref 38.4–49.9)
LYMPH%: 20.4 % (ref 14.0–49.0)
MCH: 29.3 pg (ref 27.2–33.4)
MCHC: 31.7 g/dL — ABNORMAL LOW (ref 32.0–36.0)
MCV: 92.3 fL (ref 79.3–98.0)
MONO#: 0.8 10*3/uL (ref 0.1–0.9)
MONO%: 9.1 % (ref 0.0–14.0)
NEUT%: 67.2 % (ref 39.0–75.0)
Platelets: 176 10*3/uL (ref 140–400)
RBC: 3.76 10*6/uL — ABNORMAL LOW (ref 4.20–5.82)
WBC: 8.5 10*3/uL (ref 4.0–10.3)

## 2009-10-12 ENCOUNTER — Ambulatory Visit: Payer: Self-pay | Admitting: Hematology & Oncology

## 2009-10-15 ENCOUNTER — Encounter: Payer: Self-pay | Admitting: Internal Medicine

## 2009-10-15 LAB — CBC WITH DIFFERENTIAL (CANCER CENTER ONLY)
BASO#: 0 10*3/uL (ref 0.0–0.2)
EOS%: 3.3 % (ref 0.0–7.0)
Eosinophils Absolute: 0.3 10*3/uL (ref 0.0–0.5)
HGB: 11.7 g/dL — ABNORMAL LOW (ref 13.0–17.1)
LYMPH%: 13.9 % — ABNORMAL LOW (ref 14.0–48.0)
MCH: 29.5 pg (ref 28.0–33.4)
MCHC: 33.7 g/dL (ref 32.0–35.9)
MCV: 88 fL (ref 82–98)
MONO%: 8 % (ref 0.0–13.0)
NEUT%: 74.3 % (ref 40.0–80.0)
RBC: 3.96 10*6/uL — ABNORMAL LOW (ref 4.20–5.70)

## 2009-10-15 LAB — CHCC SATELLITE - SMEAR

## 2009-11-29 ENCOUNTER — Ambulatory Visit: Payer: Self-pay | Admitting: Oncology

## 2009-12-03 LAB — CBC WITH DIFFERENTIAL/PLATELET
BASO%: 1 % (ref 0.0–2.0)
Basophils Absolute: 0.1 10*3/uL (ref 0.0–0.1)
EOS%: 3.5 % (ref 0.0–7.0)
HCT: 37.2 % — ABNORMAL LOW (ref 38.4–49.9)
HGB: 11.9 g/dL — ABNORMAL LOW (ref 13.0–17.1)
MCH: 28.5 pg (ref 27.2–33.4)
MCHC: 32 g/dL (ref 32.0–36.0)
MCV: 89.2 fL (ref 79.3–98.0)
MONO%: 12.5 % (ref 0.0–14.0)
NEUT%: 58 % (ref 39.0–75.0)
RDW: 14.3 % (ref 11.0–14.6)

## 2010-01-02 ENCOUNTER — Ambulatory Visit: Payer: Self-pay | Admitting: Hematology & Oncology

## 2010-01-04 ENCOUNTER — Encounter: Payer: Self-pay | Admitting: Internal Medicine

## 2010-01-04 LAB — CHCC SATELLITE - SMEAR

## 2010-01-04 LAB — RETICULOCYTES (CHCC): RBC.: 3.91 MIL/uL — ABNORMAL LOW (ref 4.22–5.81)

## 2010-01-04 LAB — CBC WITH DIFFERENTIAL (CANCER CENTER ONLY)
BASO%: 0.3 % (ref 0.0–2.0)
Eosinophils Absolute: 0.2 10*3/uL (ref 0.0–0.5)
LYMPH%: 21.2 % (ref 14.0–48.0)
MCV: 87 fL (ref 82–98)
MONO#: 0.5 10*3/uL (ref 0.1–0.9)
NEUT#: 4.2 10*3/uL (ref 1.5–6.5)
Platelets: 161 10*3/uL (ref 145–400)
RBC: 3.96 10*6/uL — ABNORMAL LOW (ref 4.20–5.70)
RDW: 12.7 % (ref 10.5–14.6)
WBC: 6.3 10*3/uL (ref 4.0–10.0)

## 2010-01-04 LAB — FERRITIN: Ferritin: 538 ng/mL — ABNORMAL HIGH (ref 22–322)

## 2010-01-30 ENCOUNTER — Encounter: Payer: Self-pay | Admitting: Internal Medicine

## 2010-01-31 ENCOUNTER — Encounter: Payer: Self-pay | Admitting: Internal Medicine

## 2010-02-01 ENCOUNTER — Ambulatory Visit: Payer: Self-pay | Admitting: Oncology

## 2010-02-01 LAB — CBC WITH DIFFERENTIAL/PLATELET
BASO%: 0.1 % (ref 0.0–2.0)
EOS%: 3.1 % (ref 0.0–7.0)
Eosinophils Absolute: 0.2 10*3/uL (ref 0.0–0.5)
LYMPH%: 19.4 % (ref 14.0–49.0)
MCH: 29.6 pg (ref 27.2–33.4)
MCHC: 32.9 g/dL (ref 32.0–36.0)
MCV: 89.9 fL (ref 79.3–98.0)
MONO%: 9.7 % (ref 0.0–14.0)
NEUT#: 3.9 10*3/uL (ref 1.5–6.5)
Platelets: 151 10*3/uL (ref 140–400)
RBC: 4.23 10*6/uL (ref 4.20–5.82)
RDW: 15.1 % — ABNORMAL HIGH (ref 11.0–14.6)

## 2010-05-02 ENCOUNTER — Ambulatory Visit: Payer: Self-pay | Admitting: Hematology & Oncology

## 2010-05-03 LAB — CBC WITH DIFFERENTIAL (CANCER CENTER ONLY)
BASO#: 0 10*3/uL (ref 0.0–0.2)
EOS%: 3.9 % (ref 0.0–7.0)
Eosinophils Absolute: 0.3 10*3/uL (ref 0.0–0.5)
HGB: 12.1 g/dL — ABNORMAL LOW (ref 13.0–17.1)
LYMPH%: 23.5 % (ref 14.0–48.0)
MCH: 29.9 pg (ref 28.0–33.4)
MCHC: 33.7 g/dL (ref 32.0–35.9)
MCV: 89 fL (ref 82–98)
MONO%: 9.4 % (ref 0.0–13.0)
NEUT#: 4.8 10*3/uL (ref 1.5–6.5)
RBC: 4.05 10*6/uL — ABNORMAL LOW (ref 4.20–5.70)

## 2010-05-13 ENCOUNTER — Ambulatory Visit: Payer: Self-pay | Admitting: Internal Medicine

## 2010-05-13 LAB — CONVERTED CEMR LAB
ALT: 20 units/L (ref 0–53)
AST: 20 units/L (ref 0–37)
Albumin: 3.3 g/dL — ABNORMAL LOW (ref 3.5–5.2)
Alkaline Phosphatase: 54 units/L (ref 39–117)
BUN: 28 mg/dL — ABNORMAL HIGH (ref 6–23)
Basophils Absolute: 0 10*3/uL (ref 0.0–0.1)
Basophils Relative: 0.5 % (ref 0.0–3.0)
Bilirubin Urine: NEGATIVE
Bilirubin, Direct: 0.1 mg/dL (ref 0.0–0.3)
CO2: 24 meq/L (ref 19–32)
Calcium: 8.8 mg/dL (ref 8.4–10.5)
Chloride: 115 meq/L — ABNORMAL HIGH (ref 96–112)
Cholesterol: 137 mg/dL (ref 0–200)
Creatinine, Ser: 1.9 mg/dL — ABNORMAL HIGH (ref 0.4–1.5)
Eosinophils Absolute: 0.2 10*3/uL (ref 0.0–0.7)
Eosinophils Relative: 2.5 % (ref 0.0–5.0)
GFR calc non Af Amer: 48.77 mL/min (ref >60)
Glucose, Bld: 65 mg/dL — ABNORMAL LOW (ref 70–99)
HCT: 34.7 % — ABNORMAL LOW (ref 39.0–52.0)
HDL: 43.2 mg/dL (ref >39.00)
Hemoglobin: 11.7 g/dL — ABNORMAL LOW (ref 13.0–17.0)
Ketones, ur: NEGATIVE mg/dL
LDL Cholesterol: 76 mg/dL (ref 0–99)
Leukocytes, UA: NEGATIVE
Lymphocytes Relative: 14.8 % (ref 12.0–46.0)
Lymphs Abs: 1 10*3/uL (ref 0.7–4.0)
MCHC: 33.6 g/dL (ref 30.0–36.0)
MCV: 89.9 fL (ref 78.0–100.0)
Monocytes Absolute: 0.6 10*3/uL (ref 0.1–1.0)
Monocytes Relative: 8.8 % (ref 3.0–12.0)
Neutro Abs: 5.1 10*3/uL (ref 1.4–7.7)
Neutrophils Relative %: 73.4 % (ref 43.0–77.0)
Nitrite: NEGATIVE
PSA: 1.18 ng/mL (ref 0.10–4.00)
Platelets: 146 10*3/uL — ABNORMAL LOW (ref 150.0–400.0)
Potassium: 3.9 meq/L (ref 3.5–5.1)
RBC: 3.86 M/uL — ABNORMAL LOW (ref 4.22–5.81)
RDW: 14.4 % (ref 11.5–14.6)
Sodium: 142 meq/L (ref 135–145)
Specific Gravity, Urine: >=1.03 (ref 1.000–1.030)
TSH: 1.42 microintl units/mL (ref 0.35–5.50)
Total Bilirubin: 0.8 mg/dL (ref 0.3–1.2)
Total CHOL/HDL Ratio: 3
Total Protein, Urine: >=300 mg/dL
Total Protein: 5.8 g/dL — ABNORMAL LOW (ref 6.0–8.3)
Triglycerides: 91 mg/dL (ref 0.0–149.0)
Urine Glucose: NEGATIVE mg/dL
Urobilinogen, UA: 0.2 (ref 0.0–1.0)
VLDL: 18.2 mg/dL (ref 0.0–40.0)
WBC: 6.9 10*3/uL (ref 4.5–10.5)
pH: 5 (ref 5.0–8.0)

## 2010-05-16 ENCOUNTER — Telehealth: Payer: Self-pay | Admitting: Internal Medicine

## 2010-05-16 ENCOUNTER — Ambulatory Visit: Payer: Self-pay | Admitting: Internal Medicine

## 2010-05-16 DIAGNOSIS — N529 Male erectile dysfunction, unspecified: Secondary | ICD-10-CM | POA: Insufficient documentation

## 2010-06-04 ENCOUNTER — Ambulatory Visit: Payer: Self-pay | Admitting: Hematology & Oncology

## 2010-06-05 LAB — CBC WITH DIFFERENTIAL (CANCER CENTER ONLY)
BASO#: 0 10*3/uL (ref 0.0–0.2)
EOS%: 5.6 % (ref 0.0–7.0)
Eosinophils Absolute: 0.4 10*3/uL (ref 0.0–0.5)
HCT: 38.2 % — ABNORMAL LOW (ref 38.7–49.9)
HGB: 12.5 g/dL — ABNORMAL LOW (ref 13.0–17.1)
LYMPH#: 1.3 10*3/uL (ref 0.9–3.3)
MCH: 29.3 pg (ref 28.0–33.4)
MCHC: 32.6 g/dL (ref 32.0–35.9)
MONO%: 10.1 % (ref 0.0–13.0)
NEUT%: 64 % (ref 40.0–80.0)
RBC: 4.24 10*6/uL (ref 4.20–5.70)

## 2010-07-09 ENCOUNTER — Ambulatory Visit: Payer: Self-pay | Admitting: Hematology & Oncology

## 2010-07-29 ENCOUNTER — Encounter: Payer: Self-pay | Admitting: Internal Medicine

## 2010-07-29 LAB — CBC WITH DIFFERENTIAL (CANCER CENTER ONLY)
BASO%: 0.3 % (ref 0.0–2.0)
EOS%: 3.3 % (ref 0.0–7.0)
HCT: 39.5 % (ref 38.7–49.9)
LYMPH#: 1.2 10*3/uL (ref 0.9–3.3)
MCHC: 33.2 g/dL (ref 32.0–35.9)
MONO#: 0.5 10*3/uL (ref 0.1–0.9)
NEUT#: 3.9 10*3/uL (ref 1.5–6.5)
Platelets: 173 10*3/uL (ref 145–400)
RDW: 12.7 % (ref 10.5–14.6)
WBC: 5.8 10*3/uL (ref 4.0–10.0)

## 2010-07-30 LAB — RETICULOCYTES (CHCC)
ABS Retic: 50.3 10*3/uL (ref 19.0–186.0)
RBC.: 4.57 MIL/uL (ref 4.22–5.81)
Retic Ct Pct: 1.1 % (ref 0.4–3.1)

## 2010-07-30 LAB — FERRITIN: Ferritin: 270 ng/mL (ref 22–322)

## 2010-08-28 ENCOUNTER — Telehealth: Payer: Self-pay | Admitting: Internal Medicine

## 2010-10-07 ENCOUNTER — Ambulatory Visit: Payer: Self-pay | Admitting: Oncology

## 2010-10-09 LAB — CBC WITH DIFFERENTIAL/PLATELET
BASO%: 0.7 % (ref 0.0–2.0)
EOS%: 3.1 % (ref 0.0–7.0)
MCH: 29.2 pg (ref 27.2–33.4)
MCV: 87.6 fL (ref 79.3–98.0)
MONO%: 10.7 % (ref 0.0–14.0)
RBC: 4.45 10*6/uL (ref 4.20–5.82)
RDW: 13.7 % (ref 11.0–14.6)
lymph#: 1.2 10*3/uL (ref 0.9–3.3)

## 2010-11-26 NOTE — Letter (Signed)
[facility]   Imported By: Sherian Rein 11/22/2009 13:07:22  _____________________________________________________________________  External Attachment:    Type:   Image     Comment:   External Document

## 2010-11-26 NOTE — Letter (Signed)
[facility]   Imported By: Lester [place] 02/01/2010 09:06:25  _____________________________________________________________________  External Attachment:    Type:   Image     Comment:   External Document

## 2010-11-26 NOTE — Assessment & Plan Note (Signed)
Summary: CPX/ $50 /[facility]   Vital Signs:  Patient profile:   45 year old Franco Height:      68 inches Weight:      245 pounds BMI:     37.39 O2 Sat:      95 % on Room air Temp:     98.3 degrees F oral Pulse rate:   74 / minute Pulse rhythm:   regular BP sitting:   158 / 102  (right arm) Cuff size:   large  Vitals Entered By: Lanier Prude, CMA(AAMA) (May 16, 2010 8:45 AM)  O2 Flow:  Room air CC: CPX Is Patient Diabetic? No   CC:  CPX.  History of Present Illness: The patient presents for a wellness examination He ran out BP meds 3 d ago He has put wt on  Current Medications (verified): 1)  Cellcept 250 Mg  Caps (Mycophenolate Mofetil) .... Take Once Daily 2)  Simvastatin 80 Mg  Tabs (Simvastatin) .... Take Once Daily 3)  Labetalol Hcl 200 Mg  Tabs (Labetalol Hcl) .... Take Two Times A Day 4)  Lisinopril 40 Mg  Tabs (Lisinopril) .... Take Once Daily 5)  Calcitriol 0.25 Mcg  Caps (Calcitriol) .... Take Once Daily 6)  Allopurinol 100 Mg  Tabs (Allopurinol) .... Take Once Daily 7)  Darvocet-N 100 100-650 Mg  Tabs (Propoxyphene N-Apap) .... Take As Needed and As Directed. 8)  Aranesp (Albumin Free) 100 Mcg/ml  Soln (Darbepoetin Alfa-Polysorbate) .... Q 1 Mo 9)  Lasix 40 Mg Tabs (Furosemide) .... Once Daily  Allergies (verified): 1)  ! * Aleve 2)  ! 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  The patient denies anorexia, fever, weight loss, vision loss, decreased hearing, hoarseness, chest pain, syncope, dyspnea on exertion, peripheral edema, prolonged cough, headaches, hemoptysis, abdominal pain, melena, hematochezia, severe indigestion/heartburn, hematuria, incontinence, genital sores, muscle weakness, suspicious skin lesions, transient blindness, difficulty walking, depression, unusual weight change, abnormal bleeding, enlarged lymph nodes, angioedema, and testicular masses.    Physical Exam  General:  overweight-appearing.   Head:  Normocephalic and atraumatic without obvious abnormalities. No apparent alopecia or balding. Eyes:  No corneal or conjunctival inflammation noted. EOMI. Perrla.  Ears:  External ear exam shows no significant lesions or deformities.  Otoscopic examination reveals clear canals, tympanic membranes are intact bilaterally without bulging, retraction, inflammation or discharge. Hearing is grossly normal bilaterally. Nose:  External nasal examination shows no deformity or inflammation. Nasal mucosa are pink and moist without lesions or exudates. Mouth:  Oral mucosa and oropharynx without lesions or exudates.  Teeth in good repair. Neck:  No deformities, masses, or tenderness noted. Lungs:  Normal respiratory effort, chest expands symmetrically. Lungs are clear to auscultation, no crackles or wheezes. Heart:  Normal rate and regular rhythm. S1 and S2 normal without gallop, murmur, click, rub or other extra sounds. Abdomen:  Bowel sounds positive,abdomen soft and non-tender without masses, organomegaly or hernias noted. Genitalia:  Testes bilaterally descended without nodularity, tenderness or masses. No  scrotal masses or lesions. No penis lesions or urethral discharge. Msk:  No deformity or scoliosis noted of thoracic or lumbar spine.   Extremities:  No clubbing, cyanosis, edema, or deformity noted with normal full range of motion of all joints.     Neurologic:  No cranial nerve deficits noted. Station and gait are normal. Plantar reflexes are down-going bilaterally. DTRs are symmetrical throughout. Sensory, motor and coordinative functions appear intact. Skin:  Dry Cervical Nodes:  No lymphadenopathy noted Psych:  Cognition and judgment appear intact. Alert and cooperative with normal attention span and concentration. No apparent delusions, illusions, hallucinations   Impression & Recommendations:  Problem # 1:  WELL ADULT EXAM (ICD-V70.0) Assessment New Health and age related issues were discussed. Available screening tests and vaccinations were discussed as well. Healthy life style including good diet and execise was discussed.  Orders: EKG w/ Interpretation (93000) nl The labs were reviewed with the patient.   Problem # 2:  RENAL FAILURE, CHRONIC (ICD-585.9) Assessment: Unchanged F/u w/Dr Deterding is pending   Problem # 3:  HYPERTENSION (ICD-401.9) Assessment: Deteriorated Parcel w/meds should come tonight. His updated medication list for this problem includes:    Labetalol Hcl 200 Mg Tabs (Labetalol hcl) .[name]... Take two times a day    Lisinopril 40 Mg Tabs (Lisinopril) .[name]... Take once daily    Lasix 40 Mg Tabs (Furosemide) ..... Once daily  Problem # 4:  ERECTILE DYSFUNCTION, ORGANIC (ICD-607.84) Assessment: Unchanged  His updated medication list for this problem includes:    Viagra 100 Mg Tabs (Sildenafil citrate) .[name]... 1 by mouth once daily prn  Complete Medication List: 1)  Cellcept 250 Mg Caps (Mycophenolate mofetil) .... Take once daily 2)  Simvastatin 80 Mg Tabs (Simvastatin) .... Take once daily 3)  Labetalol Hcl 200 Mg Tabs (Labetalol hcl) .... Take two times a day 4)  Lisinopril 40 Mg Tabs (Lisinopril) .... Take once daily 5)  Calcitriol 0.25 Mcg Caps (Calcitriol) .... Take once daily 6)  Allopurinol 100 Mg Tabs (Allopurinol) .... Take once daily 7)  Darvocet-n 100 100-650 Mg Tabs (Propoxyphene n-apap)  .... Take as needed and as directed. 8)  Aranesp (albumin Free) 100 Mcg/ml Soln (Darbepoetin alfa-polysorbate) .... Q 1 mo 9)  Lasix 40 Mg Tabs (Furosemide) .... Once daily 10)  Viagra 100 Mg Tabs (Sildenafil citrate) .[name].. 1 by mouth once daily prn  Patient Instructions: 1)  Please schedule a follow-up appointment in 6 months. 2)  BMP prior to visit, ICD-9: 3)  CBC w/ Diff prior to visit, ICD-9: 4)  Vit B12 995.20  401.1 Prescriptions: VIAGRA 100 MG TABS (SILDENAFIL CITRATE) 1 by mouth once daily prn  #4 x 12   Entered and Authorized by:   Tresa Garter MD   Signed by:   Tresa Garter MD on 05/16/2010   Method used:   Print then Give to Patient   RxID:   [id number] VIAGRA 100 MG TABS (SILDENAFIL CITRATE) 1 by mouth once daily prn  #12 x 6   Entered and Authorized by:   Tresa Garter MD   Signed by:   Tresa Garter MD on 05/16/2010   Method used:   Print then Give to Patient   RxID:   [id number]

## 2010-11-26 NOTE — Letter (Signed)
Summary: [facility]   Imported By: Sherian Rein 03/14/2010 10:03:12  _____________________________________________________________________  External Attachment:    Type:   Image     Comment:   External Document

## 2010-11-26 NOTE — Progress Notes (Signed)
  Phone Note Refill Request Message from:  Patient on August 28, 2010 3:32 PM  Refills Requested: Medication #1:  VIAGRA 100 MG TABS 1 by mouth once daily prn. prescriptin printed and signed put up front for pt  Initial call taken by: Ami Bullins CMA,  August 28, 2010 3:33 PM    Prescriptions: VIAGRA 100 MG TABS (SILDENAFIL CITRATE) 1 by mouth once daily prn  #90 x 3   Entered by:   Ami Bullins CMA   Authorized by:   Tresa Garter MD   Signed by:   Bill Salinas CMA on 08/28/2010   Method used:   Print then Give to Patient   RxID:   [id number]

## 2010-11-26 NOTE — Letter (Signed)
[facility]   Imported By: Sherian Rein 03/14/2010 10:04:05  _____________________________________________________________________  External Attachment:    Type:   Image     Comment:   External Document

## 2010-11-26 NOTE — Progress Notes (Signed)
Summary: RESULTS  Phone Note Call from Patient Call back at Home Phone [phone]   Summary of Call: Patient is requesting office visit and labs to kidney MD, Fax # [phone]. Dr Darrick Penna, [place]? Patient is requesting a call when complete.  Initial call taken by: Lamar Sprinkles, CMA,  May 16, 2010 1:59 PM  Follow-up for Phone Call        pls fax Follow-up by: Tresa Garter MD,  May 17, 2010 1:25 PM  Additional Follow-up for Phone Call Additional follow up Details #1::        Faxed to [facility]. Dr Arlyn Leak Additional Follow-up by: Margaret Pyle, CMA,  May 18, 2010 9:21 AM

## 2010-11-26 NOTE — Letter (Signed)
Summary: [facility]  [facility]   Imported By: Lester [place] 08/27/2010 10:07:42  _____________________________________________________________________  External Attachment:    Type:   Image     Comment:   External Document

## 2010-12-18 ENCOUNTER — Other Ambulatory Visit: Payer: Self-pay | Admitting: Hematology & Oncology

## 2010-12-18 ENCOUNTER — Encounter (HOSPITAL_BASED_OUTPATIENT_CLINIC_OR_DEPARTMENT_OTHER): Payer: BC Managed Care – PPO | Admitting: Hematology & Oncology

## 2010-12-18 DIAGNOSIS — N289 Disorder of kidney and ureter, unspecified: Secondary | ICD-10-CM

## 2010-12-18 DIAGNOSIS — D693 Immune thrombocytopenic purpura: Secondary | ICD-10-CM

## 2010-12-18 DIAGNOSIS — D649 Anemia, unspecified: Secondary | ICD-10-CM

## 2010-12-18 DIAGNOSIS — M329 Systemic lupus erythematosus, unspecified: Secondary | ICD-10-CM

## 2010-12-18 LAB — CBC WITH DIFFERENTIAL (CANCER CENTER ONLY)
Eosinophils Absolute: 0.3 10*3/uL (ref 0.0–0.5)
LYMPH#: 1.7 10*3/uL (ref 0.9–3.3)
MONO#: 0.8 10*3/uL (ref 0.1–0.9)
MONO%: 11.7 % (ref 0.0–13.0)
NEUT#: 4.2 10*3/uL (ref 1.5–6.5)
Platelets: 187 10*3/uL (ref 145–400)
RBC: 4.08 10*6/uL — ABNORMAL LOW (ref 4.20–5.70)
WBC: 7.1 10*3/uL (ref 4.0–10.0)

## 2010-12-18 LAB — RETICULOCYTES (CHCC)
ABS Retic: 44 10*3/uL (ref 19.0–186.0)
RBC.: 4 MIL/uL — ABNORMAL LOW (ref 4.22–5.81)
Retic Ct Pct: 1.1 % (ref 0.4–3.1)

## 2010-12-18 LAB — CHCC SATELLITE - SMEAR

## 2011-01-24 ENCOUNTER — Encounter: Payer: Self-pay | Admitting: Internal Medicine

## 2011-02-10 ENCOUNTER — Encounter (HOSPITAL_BASED_OUTPATIENT_CLINIC_OR_DEPARTMENT_OTHER): Payer: BLUE CROSS/BLUE SHIELD | Admitting: Hematology & Oncology

## 2011-02-10 ENCOUNTER — Other Ambulatory Visit: Payer: Self-pay | Admitting: Hematology & Oncology

## 2011-02-10 DIAGNOSIS — D693 Immune thrombocytopenic purpura: Secondary | ICD-10-CM

## 2011-02-10 DIAGNOSIS — D649 Anemia, unspecified: Secondary | ICD-10-CM

## 2011-02-10 DIAGNOSIS — M329 Systemic lupus erythematosus, unspecified: Secondary | ICD-10-CM

## 2011-02-10 DIAGNOSIS — N289 Disorder of kidney and ureter, unspecified: Secondary | ICD-10-CM

## 2011-02-10 LAB — CBC WITH DIFFERENTIAL (CANCER CENTER ONLY)
BASO#: 0 10*3/uL (ref 0.0–0.2)
BASO%: 0.5 % (ref 0.0–2.0)
EOS%: 3.7 % (ref 0.0–7.0)
MCH: 29.3 pg (ref 28.0–33.4)
MCHC: 33.9 g/dL (ref 32.0–35.9)
MONO%: 8.5 % (ref 0.0–13.0)
NEUT#: 5.5 10*3/uL (ref 1.5–6.5)
Platelets: 193 10*3/uL (ref 145–400)
RDW: 13.1 % (ref 11.1–15.7)

## 2011-02-11 ENCOUNTER — Telehealth: Payer: Self-pay | Admitting: Internal Medicine

## 2011-02-11 NOTE — Telephone Encounter (Signed)
Pt is looking for paperwork faxed over to be filled out for Viagra refill - unsure if he means [place].? Have you seen this.?

## 2011-02-11 NOTE — Telephone Encounter (Signed)
I have not seen it.

## 2011-02-14 MED ORDER — SILDENAFIL CITRATE 100 MG PO TABS: 100.0000 mg | ORAL_TABLET | ORAL | Status: DC | PRN

## 2011-03-11 NOTE — Assessment & Plan Note (Signed)
[facility]                           PRIMARY CARE OFFICE NOTE   NAME:Tyler Franco, Tyler Franco                       MRN:          [id number]  DATE:05/05/2007                            DOB:          [birth year]    The patient is a 45 year old male who presents for a wellness exam. Past  medical history, family history, and social history as per May 01, 2006  note. Current medicines reviewed; is currently on Rocaltrol 0.25 mcg  daily (new).   REVIEW OF SYSTEMS:  Blood pressure is ok at home. Systolic running  around 130, diastolic 80. No chest pain or shortness of breath. No  syncope. No neurologic complaints. The rest of the 18-point review of  system is negative.   PHYSICAL EXAMINATION:  Blood pressure 150/93, pulse 71, temperature  96.9, weight 232 pounds, looks well.  HEENT: Moist mucosa.  NECK: Supple. No thyromegaly or bruits.  LUNGS: Clear to auscultation and percussion. No wheezing or rales.  CARDIAC: S1, S2. No murmur. No gallop.  ABDOMEN: Soft, nontender. No organomegaly or mass felt.  LOWER EXTREMITIES: Without edema.  He is alert, oriented, and cooperative. Denies being depressed.  Normal genitals.  No hernias.[name]   LABORATORIES:  EKG with normal sinus rhythm. Heart rate 66. Labs on April 28, 2007, hemoglobin 11.2. MCV 86.4, sodium 142, potassium 5.4, BUN 50,  creatinine 2.3, chloride 118. Liver tests normal. Cholesterol was 231,  triglycerides 238, HDL 37.4, LDL 122, TSH 2.18, PSA 1.04, urinalysis  normal.   ASSESSMENT/PLAN:  1. Normal wellness examination. Age/health-related issues discussed,      healthy lifestyle discussed. Pneumovax given. Obtained chest x-ray.  2. Lupus (obtained chest x-ray as above) well controlled.  3. Chronic renal failure, creatinine is actually better now (was 2.7).      Follow up with Dr. Darrick Penna on July 10 pending.  4. Hyperkalemia, I gave him Kayexalate 15 grams 1 a day for 2 or 3      days. He will check  with Dr. Darrick Penna.  5. Dyslipidemia, he is currently on simvastatin. I will increase to 80      mg daily.    Georgina Quint. Plotnikov, MD  Electronically Signed   AVP/MedQ  DD: 05/10/2007  DT: 05/11/2007  Job #: [id number]   cc:   Dr. Darrick Penna

## 2011-03-14 NOTE — Procedures (Signed)
[address]. [facility]  Patient:    KHALFANI, WEIDEMAN                         MRN: [id number] Proc. Date: 07/06/00 Adm. Date:  [id number] Attending:  Sabino Gasser CC:         Llana Aliment. Deterding, M.D.  Sonda Primes, M.D. [facility]   Procedure Report  PROCEDURE PERFORMED:  Colonoscopy.  ENDOSCOPIST:  Sabino Gasser, M.D.  INDICATIONS FOR PROCEDURE:  Hemoccult positivity, iron deficiency anemia.  ANESTHESIA:  Demerol 120 mg, Versed 12 mg was given intravenously in divided dose.  DESCRIPTION OF PROCEDURE:  With the patient mildly sedated in the left lateral decubitus position, the Olympus videoscopic colonoscope was inserted in the rectum and passed under direct vision into the cecum.  The cecum was identified by the ileocecal valve and appendiceal orifice.  The former was photographed.  We entered into the terminal ileum via the ileocecal valve and this appeared normal as well.  Photographs and biopsies were taken.  From this point, the colonoscope was slowly withdrawn, taking circumferential views of the entire colonic mucos stopping only in the rectum, which appeared normal on direct and retroflex view.  Photographs were taken.  The endoscope was straightened and withdrawn.  Patients vital signs and pulse oximeter remained stable.  The patient tolerated the procedure well and without apparent complications.  FINDINGS:  Essentially negative colonoscopic examination to the cecum.  PLAN:  Follow up with endoscopy. DD:  07/06/00 TD:  07/06/00 Job: [id number]

## 2011-03-14 NOTE — Op Note (Signed)
[place]. [facility]  Patient:    Tyler Franco, Tyler Franco                         MRN: [id number] Proc. Date: 07/06/00 Adm. Date:  [id number] Attending:  Sabino Gasser CC:         Llana Aliment. Deterding, M.D.  Sonda Primes, M.D. [facility]   Operative Report  PROCEDURE PERFORMED:  Upper endoscopy.  ENDOSCOPIST:  Sabino Gasser, M.D.  INDICATIONS FOR PROCEDURE:  ANESTHESIA: None.  See colonoscopy note.  DESCRIPTION OF PROCEDURE:  With the patient mildly sedated in the left lateral decubitus position, the Olympus video endoscope was inserted in the mouth and passed under direct vision through the esophagus.  The distal esophagus was approached and showed changes of esophagitis and an early stricture. Photographs taken.  We advanced into the stomach after photograph was taken and the stomach appeared mildly erythematous.  This was photographed.  The antrum also showed some changes of erythema, photographed and subsequently biopsied.  We entered into the duodenal bulb and second portion of the duodenum, both of which appeared normal.  From this point, the endoscope was slowly withdrawn taking circumferential views of the entire duodenal mucosa until the endoscope had been pulled back into the stomach and placed on retroflexion to view the stomach from below and this showed a hiatal hernia and was photographed.  The endoscope was then straightened and pulled back from distal to proximal stomach taking circumferential views of the entire gastric and subsequently esophageal mucosa, stopping only to biopsy the erythema seen along the way in the stomach which was also biopsied.  The endoscope was withdrawn.  Patients vital signs and pulse oximeter remained stable.  The patient tolerated the procedure well without apparent complications.  FINDINGS:  Erythema of stomach biopsied.  Esophagitis biopsied with early stricture formation.  Photographed.  PLAN:  Await biopsy report.   Patient will call me for results and follow up with me as an outpatient. DD:  07/06/00 TD:  07/06/00 Job: [id number]

## 2011-03-27 ENCOUNTER — Encounter (HOSPITAL_BASED_OUTPATIENT_CLINIC_OR_DEPARTMENT_OTHER): Payer: BC Managed Care – PPO | Admitting: Oncology

## 2011-03-27 ENCOUNTER — Other Ambulatory Visit: Payer: Self-pay | Admitting: Hematology & Oncology

## 2011-03-27 DIAGNOSIS — M329 Systemic lupus erythematosus, unspecified: Secondary | ICD-10-CM

## 2011-03-27 DIAGNOSIS — N289 Disorder of kidney and ureter, unspecified: Secondary | ICD-10-CM

## 2011-03-27 DIAGNOSIS — D631 Anemia in chronic kidney disease: Secondary | ICD-10-CM

## 2011-03-27 DIAGNOSIS — N189 Chronic kidney disease, unspecified: Secondary | ICD-10-CM

## 2011-03-27 DIAGNOSIS — D693 Immune thrombocytopenic purpura: Secondary | ICD-10-CM

## 2011-03-27 LAB — CBC WITH DIFFERENTIAL/PLATELET
Eosinophils Absolute: 0.3 10*3/uL (ref 0.0–0.5)
MCV: 89.1 fL (ref 79.3–98.0)
MONO%: 11.3 % (ref 0.0–14.0)
NEUT#: 3.5 10*3/uL (ref 1.5–6.5)
RBC: 3.77 10*6/uL — ABNORMAL LOW (ref 4.20–5.82)
RDW: 13.9 % (ref 11.0–14.6)
WBC: 5.8 10*3/uL (ref 4.0–10.3)

## 2011-04-04 ENCOUNTER — Telehealth: Payer: Self-pay | Admitting: *Deleted

## 2011-04-04 NOTE — Telephone Encounter (Signed)
Pt left vm req to get aranesp injections at our office. I attempted to call pt back, had to leave vm for pt - I left vm for pt that we do not to this injection in the office and it must come from specialist.

## 2011-04-18 ENCOUNTER — Encounter (HOSPITAL_BASED_OUTPATIENT_CLINIC_OR_DEPARTMENT_OTHER): Payer: BC Managed Care – PPO | Admitting: Hematology & Oncology

## 2011-04-18 ENCOUNTER — Other Ambulatory Visit: Payer: Self-pay | Admitting: Family

## 2011-04-18 DIAGNOSIS — D693 Immune thrombocytopenic purpura: Secondary | ICD-10-CM

## 2011-04-18 DIAGNOSIS — N289 Disorder of kidney and ureter, unspecified: Secondary | ICD-10-CM

## 2011-04-18 DIAGNOSIS — D649 Anemia, unspecified: Secondary | ICD-10-CM

## 2011-04-18 DIAGNOSIS — M329 Systemic lupus erythematosus, unspecified: Secondary | ICD-10-CM

## 2011-04-18 LAB — CBC WITH DIFFERENTIAL (CANCER CENTER ONLY)
BASO#: 0.1 10*3/uL (ref 0.0–0.2)
BASO%: 0.7 % (ref 0.0–2.0)
EOS%: 3.3 % (ref 0.0–7.0)
HCT: 32.7 % — ABNORMAL LOW (ref 38.7–49.9)
HGB: 11.2 g/dL — ABNORMAL LOW (ref 13.0–17.1)
MCH: 30.1 pg (ref 28.0–33.4)
MCHC: 34.3 g/dL (ref 32.0–35.9)
MONO%: 12.2 % (ref 0.0–13.0)
NEUT#: 4.6 10*3/uL (ref 1.5–6.5)
NEUT%: 64.1 % (ref 40.0–80.0)
RDW: 13 % (ref 11.1–15.7)

## 2011-04-24 ENCOUNTER — Inpatient Hospital Stay (INDEPENDENT_AMBULATORY_CARE_PROVIDER_SITE_OTHER)
Admission: RE | Admit: 2011-04-24 | Discharge: 2011-04-24 | Disposition: A | Discharge status: Home / Self Care | Payer: BC Managed Care – PPO | Source: Ambulatory Visit | Attending: Family Medicine | Admitting: Family Medicine

## 2011-04-24 ENCOUNTER — Emergency Department (HOSPITAL_COMMUNITY)
Admission: EM | Admit: 2011-04-24 | Discharge: 2011-04-25 | Disposition: A | Discharge status: Home / Self Care | Payer: BC Managed Care – PPO | Attending: Emergency Medicine | Admitting: Emergency Medicine

## 2011-04-24 DIAGNOSIS — I10 Essential (primary) hypertension: Secondary | ICD-10-CM | POA: Insufficient documentation

## 2011-04-24 DIAGNOSIS — K449 Diaphragmatic hernia without obstruction or gangrene: Secondary | ICD-10-CM | POA: Insufficient documentation

## 2011-04-24 DIAGNOSIS — R319 Hematuria, unspecified: Secondary | ICD-10-CM | POA: Insufficient documentation

## 2011-04-24 LAB — POCT URINALYSIS DIP (DEVICE)
Leukocytes, UA: NEGATIVE
Nitrite: NEGATIVE
Protein, ur: >=300 mg/dL — AB
pH: 5.5 (ref 5.0–8.0)

## 2011-04-25 ENCOUNTER — Emergency Department (HOSPITAL_COMMUNITY): Payer: BC Managed Care – PPO

## 2011-04-25 LAB — URINE MICROSCOPIC-ADD ON

## 2011-04-25 LAB — URINALYSIS, ROUTINE W REFLEX MICROSCOPIC
Bilirubin Urine: NEGATIVE
Glucose, UA: NEGATIVE mg/dL
Ketones, ur: NEGATIVE mg/dL
Leukocytes, UA: NEGATIVE
Nitrite: NEGATIVE
Protein, ur: >300 mg/dL — AB
Specific Gravity, Urine: 1.025 (ref 1.005–1.030)
Urobilinogen, UA: 0.2 mg/dL (ref 0.0–1.0)
pH: 6 (ref 5.0–8.0)

## 2011-04-25 LAB — BASIC METABOLIC PANEL
Chloride: 110 mEq/L (ref 96–112)
Creatinine, Ser: 3.28 mg/dL — ABNORMAL HIGH (ref 0.50–1.35)
GFR calc Af Amer: 25 mL/min — ABNORMAL LOW (ref >60)
Potassium: 3.4 mEq/L — ABNORMAL LOW (ref 3.5–5.1)
Sodium: 143 mEq/L (ref 135–145)

## 2011-04-25 LAB — DIFFERENTIAL
Basophils Absolute: 0 10*3/uL (ref 0.0–0.1)
Basophils Relative: 1 % (ref 0–1)
Eosinophils Absolute: 0.3 10*3/uL (ref 0.0–0.7)
Eosinophils Relative: 5 % (ref 0–5)

## 2011-04-25 LAB — CBC
Platelets: 172 10*3/uL (ref 150–400)
RDW: 13.5 % (ref 11.5–15.5)
WBC: 6.6 10*3/uL (ref 4.0–10.5)

## 2011-04-26 LAB — URINE CULTURE: Culture: NO GROWTH

## 2011-05-09 ENCOUNTER — Other Ambulatory Visit: Payer: BC Managed Care – PPO

## 2011-05-14 ENCOUNTER — Encounter: Payer: BC Managed Care – PPO | Admitting: Internal Medicine

## 2011-06-02 ENCOUNTER — Other Ambulatory Visit: Payer: Self-pay | Admitting: Internal Medicine

## 2011-06-02 ENCOUNTER — Other Ambulatory Visit: Payer: BC Managed Care – PPO

## 2011-06-02 DIAGNOSIS — Z1289 Encounter for screening for malignant neoplasm of other sites: Secondary | ICD-10-CM

## 2011-06-02 DIAGNOSIS — Z0000 Encounter for general adult medical examination without abnormal findings: Secondary | ICD-10-CM

## 2011-06-05 ENCOUNTER — Other Ambulatory Visit (INDEPENDENT_AMBULATORY_CARE_PROVIDER_SITE_OTHER): Payer: 59 | Admitting: Internal Medicine

## 2011-06-05 ENCOUNTER — Other Ambulatory Visit (INDEPENDENT_AMBULATORY_CARE_PROVIDER_SITE_OTHER): Payer: 59

## 2011-06-05 DIAGNOSIS — Z1289 Encounter for screening for malignant neoplasm of other sites: Secondary | ICD-10-CM

## 2011-06-05 DIAGNOSIS — Z0000 Encounter for general adult medical examination without abnormal findings: Secondary | ICD-10-CM

## 2011-06-05 LAB — URINALYSIS, ROUTINE W REFLEX MICROSCOPIC
Leukocytes, UA: NEGATIVE
Nitrite: NEGATIVE
Specific Gravity, Urine: 1.025 (ref 1.000–1.030)
Total Protein, Urine: >=300
pH: 6 (ref 5.0–8.0)

## 2011-06-05 LAB — HEPATIC FUNCTION PANEL
Albumin: 2.4 g/dL — ABNORMAL LOW (ref 3.5–5.2)
Bilirubin, Direct: 0.1 mg/dL (ref 0.0–0.3)
Total Protein: 4.9 g/dL — ABNORMAL LOW (ref 6.0–8.3)

## 2011-06-05 LAB — CBC WITH DIFFERENTIAL/PLATELET
Basophils Relative: 0.6 % (ref 0.0–3.0)
Eosinophils Relative: 4.6 % (ref 0.0–5.0)
Lymphocytes Relative: 22.7 % (ref 12.0–46.0)
MCV: 90.7 fl (ref 78.0–100.0)
Monocytes Relative: 9.8 % (ref 3.0–12.0)
Neutrophils Relative %: 62.3 % (ref 43.0–77.0)
RBC: 3.61 Mil/uL — ABNORMAL LOW (ref 4.22–5.81)
WBC: 5.5 10*3/uL (ref 4.5–10.5)

## 2011-06-05 LAB — TSH: TSH: 2.31 u[IU]/mL (ref 0.35–5.50)

## 2011-06-05 LAB — BASIC METABOLIC PANEL
BUN: 33 mg/dL — ABNORMAL HIGH (ref 6–23)
Chloride: 113 mEq/L — ABNORMAL HIGH (ref 96–112)
Potassium: 3.7 mEq/L (ref 3.5–5.1)

## 2011-06-05 LAB — LIPID PANEL
Cholesterol: 199 mg/dL (ref 0–200)
LDL Cholesterol: 119 mg/dL — ABNORMAL HIGH (ref 0–99)
VLDL: 20 mg/dL (ref 0.0–40.0)

## 2011-06-06 ENCOUNTER — Encounter: Payer: Self-pay | Admitting: Internal Medicine

## 2011-06-09 ENCOUNTER — Encounter: Payer: Self-pay | Admitting: Internal Medicine

## 2011-06-09 ENCOUNTER — Ambulatory Visit (INDEPENDENT_AMBULATORY_CARE_PROVIDER_SITE_OTHER): Payer: 59 | Admitting: Internal Medicine

## 2011-06-09 DIAGNOSIS — E785 Hyperlipidemia, unspecified: Secondary | ICD-10-CM

## 2011-06-09 DIAGNOSIS — N529 Male erectile dysfunction, unspecified: Secondary | ICD-10-CM

## 2011-06-09 DIAGNOSIS — N189 Chronic kidney disease, unspecified: Secondary | ICD-10-CM

## 2011-06-09 DIAGNOSIS — I10 Essential (primary) hypertension: Secondary | ICD-10-CM

## 2011-06-09 DIAGNOSIS — Z0000 Encounter for general adult medical examination without abnormal findings: Secondary | ICD-10-CM

## 2011-06-09 DIAGNOSIS — M329 Systemic lupus erythematosus, unspecified: Secondary | ICD-10-CM

## 2011-06-09 DIAGNOSIS — E875 Hyperkalemia: Secondary | ICD-10-CM

## 2011-06-09 MED ORDER — FUROSEMIDE 40 MG PO TABS: 40.0000 mg | ORAL_TABLET | Freq: Two times a day (BID) | ORAL | Status: DC

## 2011-06-09 MED ORDER — SILDENAFIL CITRATE 100 MG PO TABS: 100.0000 mg | ORAL_TABLET | ORAL | Status: DC | PRN

## 2011-06-09 MED ORDER — AMLODIPINE BESYLATE 5 MG PO TABS: 40.0000 mg | ORAL_TABLET | Freq: Every day | ORAL | Status: DC

## 2011-06-09 MED ORDER — LISINOPRIL 20 MG PO TABS: ORAL_TABLET | ORAL | Status: DC

## 2011-06-09 MED ORDER — ALLOPURINOL 100 MG PO TABS: 100.0000 mg | ORAL_TABLET | Freq: Every day | ORAL | Status: DC

## 2011-06-09 MED ORDER — SIMVASTATIN 80 MG PO TABS: 80.0000 mg | ORAL_TABLET | Freq: Every day | ORAL | Status: DC

## 2011-06-09 MED ORDER — MYCOPHENOLATE MOFETIL 500 MG PO TABS: 500.0000 mg | ORAL_TABLET | Freq: Two times a day (BID) | ORAL | Status: DC

## 2011-06-09 MED ORDER — LISINOPRIL 40 MG PO TABS: 40.0000 mg | ORAL_TABLET | Freq: Every day | ORAL | Status: DC

## 2011-06-09 MED ORDER — SIMVASTATIN 80 MG PO TABS: 40.0000 mg | ORAL_TABLET | Freq: Every day | ORAL | Status: DC

## 2011-06-09 MED ORDER — LABETALOL HCL 200 MG PO TABS: 200.0000 mg | ORAL_TABLET | Freq: Two times a day (BID) | ORAL | Status: DC

## 2011-06-09 NOTE — Assessment & Plan Note (Addendum)
All issues discussed. EKG.

## 2011-06-09 NOTE — Progress Notes (Signed)
  Subjective:    Patient ID: Tyler Franco, male    DOB: [birth year], 45 y.o.   MRN: [id number]  HPI The patient is here for a wellness exam. The patient has been doing well overall without major physical or psychological issues going on lately. The patient needs to address  chronic hypertension that has not been well controlled with medicines; to address chronic  hyperlipidemia controlled with medicines as well; and to address CRF, controlled with medical treatment and diet.  He appears well, in no apparent distress.  Alert and oriented times three, pleasant and cooperative. Vital signs are as documented in vital signs section.   Review of Systems  Constitutional: Negative for appetite change, fatigue and unexpected weight change.  HENT: Negative for nosebleeds, congestion, sore throat, sneezing, trouble swallowing and neck pain.   Eyes: Negative for itching and visual disturbance.  Respiratory: Negative for cough.   Cardiovascular: Negative for chest pain, palpitations and leg swelling.  Gastrointestinal: Negative for nausea, diarrhea, blood in stool and abdominal distention.  Genitourinary: Negative for frequency and hematuria.  Musculoskeletal: Negative for back pain, joint swelling and gait problem.  Skin: Negative for rash.  Neurological: Negative for dizziness, tremors, speech difficulty and weakness.  Psychiatric/Behavioral: Negative for sleep disturbance, dysphoric mood and agitation. The patient is not nervous/anxious.        Objective:   Physical Exam  Constitutional: He is oriented to person, place, and time. He appears well-developed.       Obese  HENT:  Mouth/Throat: Oropharynx is clear and moist.  Eyes: Conjunctivae are normal. Pupils are equal, round, and reactive to light.  Neck: Normal range of motion. No JVD present. No thyromegaly present.  Cardiovascular: Normal rate, regular rhythm, normal heart sounds and intact distal pulses.  Exam reveals no gallop and no  friction rub.   No murmur heard. Pulmonary/Chest: Effort normal and breath sounds normal. No respiratory distress. He has no wheezes. He has no rales. He exhibits no tenderness.  Abdominal: Soft. Bowel sounds are normal. He exhibits no distension and no mass. There is no tenderness. There is no rebound and no guarding.  Musculoskeletal: Normal range of motion. He exhibits no edema and no tenderness.  Lymphadenopathy:    He has no cervical adenopathy.  Neurological: He is alert and oriented to person, place, and time. He has normal reflexes. No cranial nerve deficit. He exhibits normal muscle tone. Coordination normal.  Skin: Skin is warm and dry. Rash (eczema, mild; no new lupus lesions) noted.  Psychiatric: He has a normal mood and affect. His behavior is normal. Judgment and thought content normal.    Lab Results  Component Value Date   WBC 5.5 06/05/2011   HGB 10.7* 06/05/2011   HCT 32.7* 06/05/2011   PLT 149.0* 06/05/2011   CHOL 199 06/05/2011   TRIG 100.0 06/05/2011   HDL 59.90 06/05/2011   LDLDIRECT 122.3 04/28/2007   ALT 17 06/05/2011   AST 17 06/05/2011   NA 142 06/05/2011   K 3.7 06/05/2011   CL 113* 06/05/2011   CREATININE 3.3* 06/05/2011   BUN 33* 06/05/2011   CO2 24 06/05/2011   TSH 2.31 06/05/2011   PSA 0.90 06/05/2011       Assessment & Plan:

## 2011-06-09 NOTE — Assessment & Plan Note (Addendum)
See meds 

## 2011-06-09 NOTE — Assessment & Plan Note (Addendum)
On Rx 

## 2011-06-09 NOTE — Assessment & Plan Note (Addendum)
In remission now. 

## 2011-06-09 NOTE — Assessment & Plan Note (Addendum)
Viagra prn 

## 2011-06-09 NOTE — Assessment & Plan Note (Addendum)
Decrease Zocor - may need to switch

## 2011-06-16 ENCOUNTER — Encounter: Payer: Self-pay | Admitting: Internal Medicine

## 2011-06-24 ENCOUNTER — Telehealth: Payer: Self-pay | Admitting: *Deleted

## 2011-06-24 MED ORDER — ATORVASTATIN CALCIUM 40 MG PO TABS: 40.0000 mg | ORAL_TABLET | Freq: Every day | ORAL | Status: DC

## 2011-06-24 NOTE — Telephone Encounter (Signed)
Stop Simvastatin. Start Lipitor 40 Thx

## 2011-06-24 NOTE — Telephone Encounter (Signed)
Left mess for patient to call back. Will inform pharmacy tom during regular business hours.

## 2011-06-24 NOTE — Telephone Encounter (Signed)
Rec fax from [facility] stating the FDA has changed dosage recommendations for co-admin of Simvastatin and amlodipine. If they must be administered together, do not exceed 20mg /day of simvastatin.  A new rx for simvast 80mg  was received. Also, this exceeds the max dose of 40mg /day. Which is correct for this pt. Please advise do you want to change any meds?  Notify pharmacy at [phone] ref # [id number]

## 2011-06-25 NOTE — Telephone Encounter (Signed)
Pt informed

## 2011-06-27 ENCOUNTER — Telehealth: Payer: Self-pay | Admitting: *Deleted

## 2011-06-27 MED ORDER — AMLODIPINE BESYLATE 5 MG PO TABS: 5.0000 mg | ORAL_TABLET | Freq: Every day | ORAL | Status: DC

## 2011-06-27 NOTE — Telephone Encounter (Signed)
Left vm for patient to call office back to confirm dose of med he is taking.

## 2011-06-27 NOTE — Telephone Encounter (Signed)
I'm not sure... Norvasc 40 mg a day was a strange error. I would prefer if he takes meds as on the currrent corrected med list as shown.  Thx

## 2011-06-27 NOTE — Telephone Encounter (Signed)
[facility] called - Amlodipine RX states take 40 mg (8 tabs) once daily but qty was #90. Daily reccommended dose is 10 mg, is 40 mg correct?

## 2011-06-27 NOTE — Telephone Encounter (Signed)
Pt informed

## 2011-06-27 NOTE — Telephone Encounter (Signed)
Spoke w/pt - He is NOT taking amlodipine. Per Pt, MD was going to call in new BP Rx at last OV but pt says it was NOT amlodipine. Dr Macario Golds, what additional RX does pt need for his BP? He is currently taking furosemide, labetalol & lisinopril.

## 2011-07-01 NOTE — Telephone Encounter (Signed)
Copy of updated med list mailed to pt.

## 2011-07-22 ENCOUNTER — Ambulatory Visit: Payer: 59 | Admitting: Internal Medicine

## 2011-07-28 LAB — POCT URINALYSIS DIP (DEVICE)
Glucose, UA: NEGATIVE
Nitrite: NEGATIVE
Operator id: 247071
Protein, ur: >=300 — AB
Specific Gravity, Urine: 1.015
Urobilinogen, UA: 0.2

## 2011-08-25 ENCOUNTER — Other Ambulatory Visit: Payer: Self-pay | Admitting: Hematology & Oncology

## 2011-08-25 ENCOUNTER — Encounter (HOSPITAL_BASED_OUTPATIENT_CLINIC_OR_DEPARTMENT_OTHER): Payer: 59 | Admitting: Hematology & Oncology

## 2011-08-25 DIAGNOSIS — I10 Essential (primary) hypertension: Secondary | ICD-10-CM

## 2011-08-25 DIAGNOSIS — D649 Anemia, unspecified: Secondary | ICD-10-CM

## 2011-08-25 DIAGNOSIS — N289 Disorder of kidney and ureter, unspecified: Secondary | ICD-10-CM

## 2011-08-25 DIAGNOSIS — M329 Systemic lupus erythematosus, unspecified: Secondary | ICD-10-CM

## 2011-08-25 DIAGNOSIS — D693 Immune thrombocytopenic purpura: Secondary | ICD-10-CM

## 2011-08-25 LAB — CBC WITH DIFFERENTIAL (CANCER CENTER ONLY)
BASO%: 0.5 % (ref 0.0–2.0)
Eosinophils Absolute: 0.2 10*3/uL (ref 0.0–0.5)
LYMPH%: 17 % (ref 14.0–48.0)
MONO#: 1.1 10*3/uL — ABNORMAL HIGH (ref 0.1–0.9)
NEUT#: 4.9 10*3/uL (ref 1.5–6.5)
Platelets: 164 10*3/uL (ref 145–400)
RBC: 3.21 10*6/uL — ABNORMAL LOW (ref 4.20–5.70)
RDW: 12.7 % (ref 11.1–15.7)
WBC: 7.5 10*3/uL (ref 4.0–10.0)

## 2011-08-25 LAB — CHCC SATELLITE - SMEAR

## 2011-08-25 LAB — FERRITIN: Ferritin: 332 ng/mL — ABNORMAL HIGH (ref 22–322)

## 2011-08-25 LAB — IRON AND TIBC: Iron: 28 ug/dL — ABNORMAL LOW (ref 42–165)

## 2011-08-25 LAB — RETICULOCYTES (CHCC): ABS Retic: 33.2 10*3/uL (ref 19.0–186.0)

## 2011-09-03 ENCOUNTER — Ambulatory Visit: Payer: 59 | Admitting: Internal Medicine

## 2011-09-03 ENCOUNTER — Telehealth: Payer: Self-pay | Admitting: *Deleted

## 2011-09-03 NOTE — Telephone Encounter (Signed)
Request for [2] medications that were not requested at last OV: Calcitriol & Normodyne to [facility].  LMOM for patient to call back for clarification, as he is requesting 0.5 mg caps BID for Calcitriol & EMR state he takes 0.25mg  cap Once a Day [patient is in chronic renal failure].

## 2011-09-16 ENCOUNTER — Telehealth: Payer: Self-pay | Admitting: Student

## 2011-09-16 ENCOUNTER — Other Ambulatory Visit: Payer: Self-pay | Admitting: Student

## 2011-09-16 MED ORDER — LABETALOL HCL 200 MG PO TABS: 200.0000 mg | ORAL_TABLET | Freq: Two times a day (BID) | ORAL | Status: DC

## 2011-09-16 MED ORDER — CALCITRIOL 0.5 MCG PO CAPS: 0.5000 ug | ORAL_CAPSULE | Freq: Every day | ORAL | Status: DC

## 2011-09-16 NOTE — Telephone Encounter (Signed)
Ok both x 12 mo Thx

## 2011-09-16 NOTE — Telephone Encounter (Signed)
Pt. Would like prescription for calcitrol 0.35mcg and labetalol 200mg . Please advise.

## 2011-09-22 ENCOUNTER — Ambulatory Visit (HOSPITAL_BASED_OUTPATIENT_CLINIC_OR_DEPARTMENT_OTHER): Payer: 59 | Admitting: Lab

## 2011-09-22 ENCOUNTER — Other Ambulatory Visit: Payer: Self-pay | Admitting: Hematology & Oncology

## 2011-09-22 VITALS — BP 170/112 | HR 76 | Temp 97.2°F

## 2011-09-22 DIAGNOSIS — N289 Disorder of kidney and ureter, unspecified: Secondary | ICD-10-CM

## 2011-09-22 DIAGNOSIS — N189 Chronic kidney disease, unspecified: Secondary | ICD-10-CM

## 2011-09-22 DIAGNOSIS — D649 Anemia, unspecified: Secondary | ICD-10-CM

## 2011-09-22 LAB — CBC WITH DIFFERENTIAL/PLATELET
BASO%: 1 % (ref 0.0–2.0)
Basophils Absolute: 0.1 10*3/uL (ref 0.0–0.1)
EOS%: 6 % (ref 0.0–7.0)
HCT: 30.6 % — ABNORMAL LOW (ref 38.4–49.9)
HGB: 9.9 g/dL — ABNORMAL LOW (ref 13.0–17.1)
LYMPH%: 21.9 % (ref 14.0–49.0)
MCH: 28 pg (ref 27.2–33.4)
MCHC: 32.4 g/dL (ref 32.0–36.0)
MCV: 86.7 fL (ref 79.3–98.0)
NEUT%: 58.8 % (ref 39.0–75.0)
Platelets: 210 10*3/uL (ref 140–400)

## 2011-09-22 MED ORDER — DARBEPOETIN ALFA-POLYSORBATE 500 MCG/ML IJ SOLN
300.0000 ug | Freq: Once | INTRAMUSCULAR | Status: AC
  Administered 2011-09-22: 300 ug via SUBCUTANEOUS
  Filled 2011-09-22: qty 1

## 2011-09-24 ENCOUNTER — Telehealth: Payer: Self-pay | Admitting: *Deleted

## 2011-09-24 NOTE — Telephone Encounter (Signed)
Pt states he requested refill for Lupus medication via [facility] but received Labetalol instead. Pt requesting Rx refill for Hydroxychloroquine [Plaquenil] to [facility] that he states he takes for Lupus but there is no record of this medication in his EMR; instead his EMR chart shows that he takes Mycophenolate [Cellcept] since 04.28.09 Please advise.

## 2011-09-24 NOTE — Telephone Encounter (Signed)
Spoke w/MD about this matter, LMOM for patient to callback w/updated information concerning medication: when did he start, who prescribed, verify Ok to update change in EMR.

## 2011-09-25 MED ORDER — HYDROXYCHLOROQUINE SULFATE 200 MG PO TABS: 200.0000 mg | ORAL_TABLET | Freq: Two times a day (BID) | ORAL | Status: DC

## 2011-09-25 NOTE — Telephone Encounter (Signed)
Per patient, he is taking both medications in question, the Hydroxychloroquine was prescribed by Lupus MD. Per MD, adding to medication list & ordering refill. Rx Done.

## 2011-10-17 ENCOUNTER — Other Ambulatory Visit: Payer: Self-pay | Admitting: Hematology & Oncology

## 2011-10-17 ENCOUNTER — Other Ambulatory Visit (HOSPITAL_BASED_OUTPATIENT_CLINIC_OR_DEPARTMENT_OTHER): Payer: 59

## 2011-10-17 ENCOUNTER — Other Ambulatory Visit: Payer: Self-pay | Admitting: *Deleted

## 2011-10-17 DIAGNOSIS — D631 Anemia in chronic kidney disease: Secondary | ICD-10-CM

## 2011-10-17 DIAGNOSIS — N189 Chronic kidney disease, unspecified: Secondary | ICD-10-CM

## 2011-10-17 DIAGNOSIS — D649 Anemia, unspecified: Secondary | ICD-10-CM

## 2011-10-17 LAB — CBC WITH DIFFERENTIAL/PLATELET
BASO%: 0.7 % (ref 0.0–2.0)
Eosinophils Absolute: 0.3 10*3/uL (ref 0.0–0.5)
LYMPH%: 24 % (ref 14.0–49.0)
MCHC: 32.6 g/dL (ref 32.0–36.0)
MONO#: 0.6 10*3/uL (ref 0.1–0.9)
NEUT#: 3.4 10*3/uL (ref 1.5–6.5)
Platelets: 198 10*3/uL (ref 140–400)
RBC: 3.98 10*6/uL — ABNORMAL LOW (ref 4.20–5.82)
RDW: 13.6 % (ref 11.0–14.6)
WBC: 5.7 10*3/uL (ref 4.0–10.3)

## 2011-10-20 ENCOUNTER — Ambulatory Visit: Payer: 59 | Admitting: Internal Medicine

## 2011-11-24 ENCOUNTER — Encounter: Payer: 59 | Admitting: Hematology & Oncology

## 2011-11-24 ENCOUNTER — Other Ambulatory Visit: Payer: 59 | Admitting: Lab

## 2011-11-24 ENCOUNTER — Ambulatory Visit (HOSPITAL_BASED_OUTPATIENT_CLINIC_OR_DEPARTMENT_OTHER): Payer: 59

## 2011-11-24 ENCOUNTER — Other Ambulatory Visit (HOSPITAL_BASED_OUTPATIENT_CLINIC_OR_DEPARTMENT_OTHER): Payer: 59 | Admitting: Lab

## 2011-11-24 DIAGNOSIS — N289 Disorder of kidney and ureter, unspecified: Secondary | ICD-10-CM

## 2011-11-24 DIAGNOSIS — D649 Anemia, unspecified: Secondary | ICD-10-CM

## 2011-11-24 DIAGNOSIS — N189 Chronic kidney disease, unspecified: Secondary | ICD-10-CM

## 2011-11-24 LAB — CBC WITH DIFFERENTIAL (CANCER CENTER ONLY)
BASO%: 0.4 % (ref 0.0–2.0)
Eosinophils Absolute: 0.2 10*3/uL (ref 0.0–0.5)
HCT: 30.4 % — ABNORMAL LOW (ref 38.7–49.9)
LYMPH#: 1.1 10*3/uL (ref 0.9–3.3)
MCV: 87 fL (ref 82–98)
MONO#: 0.7 10*3/uL (ref 0.1–0.9)
NEUT%: 70.8 % (ref 40.0–80.0)
Platelets: 155 10*3/uL (ref 145–400)
RBC: 3.51 10*6/uL — ABNORMAL LOW (ref 4.20–5.70)
RDW: 13.8 % (ref 11.1–15.7)
WBC: 6.9 10*3/uL (ref 4.0–10.0)

## 2011-11-24 MED ORDER — DARBEPOETIN ALFA-POLYSORBATE 300 MCG/0.6ML IJ SOLN: 300.0000 ug | Freq: Once | INTRAMUSCULAR | Status: DC

## 2011-12-05 ENCOUNTER — Encounter: Payer: Self-pay | Admitting: *Deleted

## 2011-12-05 ENCOUNTER — Telehealth: Payer: Self-pay | Admitting: *Deleted

## 2011-12-05 DIAGNOSIS — Z125 Encounter for screening for malignant neoplasm of prostate: Secondary | ICD-10-CM

## 2011-12-05 DIAGNOSIS — Z0000 Encounter for general adult medical examination without abnormal findings: Secondary | ICD-10-CM

## 2011-12-05 NOTE — Telephone Encounter (Signed)
Labs for 01/2012 CPE entered.

## 2011-12-05 NOTE — Telephone Encounter (Signed)
Message copied by Merrilyn Puma on Fri Dec 05, 2011 11:50 AM ------      Message from: COUSIN, SHARON T      Created: Thu Nov 27, 2011  3:05 PM      Regarding: 02/13/12---PHY DATE       Lynford Humphrey

## 2011-12-22 ENCOUNTER — Ambulatory Visit (HOSPITAL_BASED_OUTPATIENT_CLINIC_OR_DEPARTMENT_OTHER): Payer: 59 | Admitting: Hematology & Oncology

## 2011-12-22 ENCOUNTER — Other Ambulatory Visit: Payer: Self-pay | Admitting: Oncology

## 2011-12-22 ENCOUNTER — Telehealth: Payer: Self-pay | Admitting: Hematology & Oncology

## 2011-12-22 ENCOUNTER — Other Ambulatory Visit (HOSPITAL_BASED_OUTPATIENT_CLINIC_OR_DEPARTMENT_OTHER): Payer: 59 | Admitting: Lab

## 2011-12-22 DIAGNOSIS — D631 Anemia in chronic kidney disease: Secondary | ICD-10-CM

## 2011-12-22 DIAGNOSIS — D649 Anemia, unspecified: Secondary | ICD-10-CM

## 2011-12-22 DIAGNOSIS — N289 Disorder of kidney and ureter, unspecified: Secondary | ICD-10-CM

## 2011-12-22 LAB — IRON AND TIBC
%SAT: 43 % (ref 20–55)
TIBC: 194 ug/dL — ABNORMAL LOW (ref 215–435)

## 2011-12-22 LAB — CBC WITH DIFFERENTIAL (CANCER CENTER ONLY)
BASO#: 0 10*3/uL (ref 0.0–0.2)
BASO%: 0.5 % (ref 0.0–2.0)
EOS%: 3 % (ref 0.0–7.0)
HCT: 32.3 % — ABNORMAL LOW (ref 38.7–49.9)
LYMPH#: 1.1 10*3/uL (ref 0.9–3.3)
LYMPH%: 18.9 % (ref 14.0–48.0)
MCH: 28.8 pg (ref 28.0–33.4)
MCHC: 32.8 g/dL (ref 32.0–35.9)
MONO%: 9.6 % (ref 0.0–13.0)
NEUT%: 68 % (ref 40.0–80.0)
RDW: 14.5 % (ref 11.1–15.7)

## 2011-12-22 LAB — COMPREHENSIVE METABOLIC PANEL
AST: 14 U/L (ref 0–37)
Albumin: 3.1 g/dL — ABNORMAL LOW (ref 3.5–5.2)
Alkaline Phosphatase: 43 U/L (ref 39–117)
BUN: 66 mg/dL — ABNORMAL HIGH (ref 6–23)
Creatinine, Ser: 5.42 mg/dL — ABNORMAL HIGH (ref 0.50–1.35)
Potassium: 5.2 mEq/L (ref 3.5–5.3)
Total Bilirubin: 0.5 mg/dL (ref 0.3–1.2)

## 2011-12-22 LAB — RETICULOCYTES (CHCC)
RBC.: 3.83 MIL/uL — ABNORMAL LOW (ref 4.22–5.81)
Retic Ct Pct: 0.7 % (ref 0.4–2.3)

## 2011-12-22 NOTE — Telephone Encounter (Signed)
Mailed schedule thru oct 13

## 2011-12-22 NOTE — Progress Notes (Signed)
This office note has been dictated.

## 2011-12-23 NOTE — Progress Notes (Signed)
CC:   James L. Deterding, M.D. Georgina Quint. Plotnikov, MD  DIAGNOSIS: 1. Anemia of renal insufficiency. 2. Systemic lupus. 3. Immune thrombocytopenia-clinical remission.  CURRENT THERAPY: 1. Aranesp 300 mcg subcutaneous as needed for hemoglobin less than 10.  INTERIM HISTORY:  Tyler Franco comes in for his followup.  He is getting married in April.  I am happy for him.  He feels okay.  He has had no problems bleeding.  He has had no further issues with his kidneys.  When we last saw him in October, his iron studies showed iron saturation of 17%.  His total iron was 128.  Ferritin was 332.  We decided to hold off on iron as he was feeling okay.  He is still having financial issues. He is wondering if we might be able to be more liberal with not having to give him Aranesp.  I told him that we could only give Aranesp if his hemoglobin is less than 10.  He has had no problems with nausea, vomiting.  He is still working without any difficulties.  He has had no change in bowel or bladder habits.  He has not noted any rashes.  There are no joint problems.  PHYSICAL EXAM:  General: This is a well-developed, well-nourished, African American gentleman in no obvious distress.  Vital signs: Temperature 97.3, pulse 67, respiratory 18, blood pressure 153/99, and weight is 238.  Head and neck exam shows a normocephalic, atraumatic skull.  There are no ocular or oral lesions.  There are no palpable cervical, supraclavicular lymph nodes.  Lungs are clear bilaterally. Cardiac examination:  Regular rate and rhythm with a normal S1, S2.  He has a 2/6 systolic ejection murmur.  Back exam: Shows no tenderness over the spine, ribs, or hips.  Extremities: Shows no clubbing, cyanosis, or edema.  Neurological exam: Shows no focal neurological deficits.  Skin exam: Shows some slightly dry skin.  LABORATORY STUDIES:  Show white cell count of 6, hemoglobin 10.6, hematocrit 32.3, and platelet count is 156.   MCV is 88.  IMPRESSION:  Tyler Franco is a 46 year old African American gentleman with anemia of renal insufficiency.  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  This basically has been in remission for several years.  I do not see any issues with lupus "flaring up."  We again, will be a little bit more liberal with having to hold on his Aranesp.  I understand this for Tyler Franco.  He is still relatively young and decent shape, so he can take a hemoglobin of about 10 or so.  We will plan on Tyler Franco coming back to see me in another 4 or 5 months. He still wants to have his lab work done monthly.    ______________________________ Josph Macho, M.D. PRE/MEDQ  D:  12/22/2011  T:  12/23/2011  Job:  [id number]

## 2011-12-24 ENCOUNTER — Other Ambulatory Visit: Payer: Self-pay | Admitting: *Deleted

## 2011-12-24 ENCOUNTER — Telehealth: Payer: Self-pay | Admitting: *Deleted

## 2011-12-24 MED ORDER — MYCOPHENOLATE MOFETIL 500 MG PO TABS: 500.0000 mg | ORAL_TABLET | Freq: Two times a day (BID) | ORAL | Status: DC

## 2011-12-24 NOTE — Telephone Encounter (Signed)
Called patient to let him know that his iron levels were ok per dr. Lupita Leash. Left message on personal cell phone

## 2011-12-24 NOTE — Telephone Encounter (Signed)
Message copied by Anselm Jungling on Wed Dec 24, 2011 11:04 AM ------      Message from: Arlan Organ R      Created: Mon Dec 22, 2011  6:47 PM       Call and tell him iron is ok.  Tyler Franco

## 2011-12-24 NOTE — Telephone Encounter (Signed)
Pt informed rx sent via VM and to callback office with any questions/concerns.  

## 2011-12-24 NOTE — Telephone Encounter (Addendum)
Pt needs refill of Mycophenolate for 90 day supply sent to [facility]. Pt states that rx was sent in for only 30 day supply

## 2012-01-06 ENCOUNTER — Telehealth: Payer: Self-pay | Admitting: *Deleted

## 2012-01-06 MED ORDER — MYCOPHENOLATE MOFETIL 500 MG PO TABS: 500.0000 mg | ORAL_TABLET | Freq: Two times a day (BID) | ORAL | Status: DC

## 2012-01-06 NOTE — Telephone Encounter (Signed)
Called pt back no answer LMOM rx sent to [facility]... 01/06/12@4 :11pm/LMB

## 2012-01-06 NOTE — Telephone Encounter (Signed)
Left msg on vm mail service was change to [facility]. MD only sent in 30 day to pharmacy. Need cellcept sent to [facility].... 01/06/12@3 :50pm/LMB

## 2012-01-19 ENCOUNTER — Other Ambulatory Visit (HOSPITAL_BASED_OUTPATIENT_CLINIC_OR_DEPARTMENT_OTHER): Payer: 59 | Admitting: Lab

## 2012-01-19 ENCOUNTER — Ambulatory Visit (HOSPITAL_BASED_OUTPATIENT_CLINIC_OR_DEPARTMENT_OTHER): Payer: 59

## 2012-01-19 ENCOUNTER — Other Ambulatory Visit: Payer: Self-pay | Admitting: Hematology & Oncology

## 2012-01-19 VITALS — BP 177/104 | HR 73 | Temp 97.1°F

## 2012-01-19 DIAGNOSIS — N289 Disorder of kidney and ureter, unspecified: Secondary | ICD-10-CM

## 2012-01-19 DIAGNOSIS — D649 Anemia, unspecified: Secondary | ICD-10-CM

## 2012-01-19 DIAGNOSIS — N189 Chronic kidney disease, unspecified: Secondary | ICD-10-CM

## 2012-01-19 DIAGNOSIS — D631 Anemia in chronic kidney disease: Secondary | ICD-10-CM

## 2012-01-19 DIAGNOSIS — Z0000 Encounter for general adult medical examination without abnormal findings: Secondary | ICD-10-CM

## 2012-01-19 LAB — COMPREHENSIVE METABOLIC PANEL
ALT: 15 U/L (ref 0–53)
AST: 13 U/L (ref 0–37)
Albumin: 3.2 g/dL — ABNORMAL LOW (ref 3.5–5.2)
Calcium: 8.8 mg/dL (ref 8.4–10.5)
Chloride: 115 mEq/L — ABNORMAL HIGH (ref 96–112)
Potassium: 5.5 mEq/L — ABNORMAL HIGH (ref 3.5–5.3)
Sodium: 140 mEq/L (ref 135–145)
Total Protein: 5.5 g/dL — ABNORMAL LOW (ref 6.0–8.3)

## 2012-01-19 LAB — CBC & DIFF AND RETIC
Basophils Absolute: 0 10*3/uL (ref 0.0–0.1)
EOS%: 3.8 % (ref 0.0–7.0)
HCT: 29.7 % — ABNORMAL LOW (ref 38.4–49.9)
HGB: 9.7 g/dL — ABNORMAL LOW (ref 13.0–17.1)
Immature Retic Fract: 4.8 % (ref 3.00–10.60)
MCH: 28.1 pg (ref 27.2–33.4)
MONO#: 0.7 10*3/uL (ref 0.1–0.9)
NEUT%: 65.6 % (ref 39.0–75.0)
lymph#: 1.6 10*3/uL (ref 0.9–3.3)

## 2012-01-19 MED ORDER — DARBEPOETIN ALFA-POLYSORBATE 300 MCG/0.6ML IJ SOLN: 300.0000 ug | Freq: Once | INTRAMUSCULAR | Status: DC

## 2012-01-19 MED ORDER — DARBEPOETIN ALFA-POLYSORBATE 300 MCG/0.6ML IJ SOLN
300.0000 ug | Freq: Once | INTRAMUSCULAR | Status: AC
  Administered 2012-01-19: 300 ug via SUBCUTANEOUS
  Filled 2012-01-19 (×2): qty 0.6

## 2012-01-23 ENCOUNTER — Telehealth: Payer: Self-pay | Admitting: *Deleted

## 2012-01-23 NOTE — Telephone Encounter (Addendum)
Message copied by Mirian Capuchin on Fri Jan 23, 2012 10:14 AM ------      Message from: Arlan Organ R      Created: Mon Jan 19, 2012  6:52 PM       Please call and tell him the iron levels are good thanks  This message was left on pt's home answering  Machine.

## 2012-02-13 ENCOUNTER — Encounter: Payer: 59 | Admitting: Internal Medicine

## 2012-02-16 ENCOUNTER — Ambulatory Visit: Payer: 59

## 2012-02-16 ENCOUNTER — Other Ambulatory Visit: Payer: 59 | Admitting: Lab

## 2012-02-17 ENCOUNTER — Other Ambulatory Visit: Payer: Self-pay | Admitting: *Deleted

## 2012-02-17 MED ORDER — CALCITRIOL 0.5 MCG PO CAPS: 0.5000 ug | ORAL_CAPSULE | Freq: Every day | ORAL | Status: DC

## 2012-02-17 NOTE — Telephone Encounter (Signed)
Pt request Calcitrol 0.5mg  90-day supply to [facility]; Done/SLS

## 2012-03-08 ENCOUNTER — Other Ambulatory Visit (HOSPITAL_BASED_OUTPATIENT_CLINIC_OR_DEPARTMENT_OTHER): Payer: 59 | Admitting: Lab

## 2012-03-08 ENCOUNTER — Ambulatory Visit (HOSPITAL_BASED_OUTPATIENT_CLINIC_OR_DEPARTMENT_OTHER): Payer: 59

## 2012-03-08 VITALS — BP 157/104 | HR 70 | Temp 96.7°F

## 2012-03-08 DIAGNOSIS — D631 Anemia in chronic kidney disease: Secondary | ICD-10-CM

## 2012-03-08 DIAGNOSIS — N289 Disorder of kidney and ureter, unspecified: Secondary | ICD-10-CM

## 2012-03-08 DIAGNOSIS — M329 Systemic lupus erythematosus, unspecified: Secondary | ICD-10-CM

## 2012-03-08 DIAGNOSIS — D649 Anemia, unspecified: Secondary | ICD-10-CM

## 2012-03-08 DIAGNOSIS — N189 Chronic kidney disease, unspecified: Secondary | ICD-10-CM

## 2012-03-08 LAB — CBC WITH DIFFERENTIAL (CANCER CENTER ONLY)
BASO#: 0 10*3/uL (ref 0.0–0.2)
Eosinophils Absolute: 0.4 10*3/uL (ref 0.0–0.5)
HCT: 28.2 % — ABNORMAL LOW (ref 38.7–49.9)
HGB: 9 g/dL — ABNORMAL LOW (ref 13.0–17.1)
LYMPH#: 1.2 10*3/uL (ref 0.9–3.3)
MONO#: 0.7 10*3/uL (ref 0.1–0.9)
NEUT%: 67.5 % (ref 40.0–80.0)
WBC: 7.5 10*3/uL (ref 4.0–10.0)

## 2012-03-08 MED ORDER — DARBEPOETIN ALFA-POLYSORBATE 300 MCG/0.6ML IJ SOLN
300.0000 ug | Freq: Once | INTRAMUSCULAR | Status: AC
  Administered 2012-03-08: 300 ug via SUBCUTANEOUS

## 2012-03-08 NOTE — Progress Notes (Signed)
Addended by: Rosario Adie A on: 03/08/2012 09:50 AM   Modules accepted: Orders

## 2012-03-15 ENCOUNTER — Other Ambulatory Visit: Payer: 59 | Admitting: Lab

## 2012-03-15 ENCOUNTER — Ambulatory Visit: Payer: 59

## 2012-03-17 ENCOUNTER — Other Ambulatory Visit: Payer: 59 | Admitting: Lab

## 2012-03-17 ENCOUNTER — Ambulatory Visit: Payer: 59

## 2012-04-08 ENCOUNTER — Ambulatory Visit (HOSPITAL_BASED_OUTPATIENT_CLINIC_OR_DEPARTMENT_OTHER): Payer: BC Managed Care – PPO

## 2012-04-08 ENCOUNTER — Other Ambulatory Visit (HOSPITAL_BASED_OUTPATIENT_CLINIC_OR_DEPARTMENT_OTHER): Payer: BC Managed Care – PPO | Admitting: Lab

## 2012-04-08 VITALS — BP 147/96 | HR 67 | Temp 97.0°F

## 2012-04-08 DIAGNOSIS — D631 Anemia in chronic kidney disease: Secondary | ICD-10-CM

## 2012-04-08 DIAGNOSIS — N289 Disorder of kidney and ureter, unspecified: Secondary | ICD-10-CM

## 2012-04-08 DIAGNOSIS — N189 Chronic kidney disease, unspecified: Secondary | ICD-10-CM

## 2012-04-08 DIAGNOSIS — D649 Anemia, unspecified: Secondary | ICD-10-CM

## 2012-04-08 LAB — CBC WITH DIFFERENTIAL (CANCER CENTER ONLY)
BASO#: 0.1 10*3/uL (ref 0.0–0.2)
EOS%: 4.1 % (ref 0.0–7.0)
HCT: 28.6 % — ABNORMAL LOW (ref 38.7–49.9)
HGB: 9.2 g/dL — ABNORMAL LOW (ref 13.0–17.1)
LYMPH#: 1.3 10*3/uL (ref 0.9–3.3)
MCH: 28.8 pg (ref 28.0–33.4)
MCHC: 32.2 g/dL (ref 32.0–35.9)
MONO%: 12.5 % (ref 0.0–13.0)
NEUT%: 60.6 % (ref 40.0–80.0)

## 2012-04-08 LAB — IRON AND TIBC
%SAT: 41 % (ref 20–55)
UIBC: 121 ug/dL — ABNORMAL LOW (ref 125–400)

## 2012-04-08 LAB — RETICULOCYTES (CHCC)
ABS Retic: 19.6 10*3/uL (ref 19.0–186.0)
RBC.: 3.27 MIL/uL — ABNORMAL LOW (ref 4.22–5.81)

## 2012-04-08 MED ORDER — DARBEPOETIN ALFA-POLYSORBATE 300 MCG/0.6ML IJ SOLN
300.0000 ug | Freq: Once | INTRAMUSCULAR | Status: AC
  Administered 2012-04-08: 300 ug via SUBCUTANEOUS

## 2012-04-12 ENCOUNTER — Other Ambulatory Visit: Payer: 59

## 2012-04-12 ENCOUNTER — Other Ambulatory Visit: Payer: 59 | Admitting: Lab

## 2012-04-12 ENCOUNTER — Ambulatory Visit: Payer: 59

## 2012-04-22 ENCOUNTER — Telehealth: Payer: Self-pay | Admitting: Internal Medicine

## 2012-04-22 ENCOUNTER — Other Ambulatory Visit (INDEPENDENT_AMBULATORY_CARE_PROVIDER_SITE_OTHER): Payer: BC Managed Care – PPO

## 2012-04-22 DIAGNOSIS — Z125 Encounter for screening for malignant neoplasm of prostate: Secondary | ICD-10-CM

## 2012-04-22 DIAGNOSIS — Z0000 Encounter for general adult medical examination without abnormal findings: Secondary | ICD-10-CM

## 2012-04-22 LAB — CBC WITH DIFFERENTIAL/PLATELET
Basophils Relative: 0.4 % (ref 0.0–3.0)
Hemoglobin: 9 g/dL — ABNORMAL LOW (ref 13.0–17.0)
Lymphocytes Relative: 18.2 % (ref 12.0–46.0)
Monocytes Relative: 11.2 % (ref 3.0–12.0)
Neutro Abs: 4 10*3/uL (ref 1.4–7.7)
RBC: 3.16 Mil/uL — ABNORMAL LOW (ref 4.22–5.81)

## 2012-04-22 LAB — BASIC METABOLIC PANEL
BUN: 58 mg/dL — ABNORMAL HIGH (ref 6–23)
Chloride: 114 mEq/L — ABNORMAL HIGH (ref 96–112)
Potassium: 4.4 mEq/L (ref 3.5–5.1)

## 2012-04-22 LAB — URINALYSIS, ROUTINE W REFLEX MICROSCOPIC
Bilirubin Urine: NEGATIVE
Total Protein, Urine: >=300
Urine Glucose: NEGATIVE

## 2012-04-22 LAB — LIPID PANEL
HDL: 35 mg/dL — ABNORMAL LOW (ref >39.00)
LDL Cholesterol: 68 mg/dL (ref 0–99)
Total CHOL/HDL Ratio: 3
VLDL: 18.2 mg/dL (ref 0.0–40.0)

## 2012-04-22 LAB — HEPATIC FUNCTION PANEL
ALT: 22 U/L (ref 0–53)
AST: 19 U/L (ref 0–37)
Bilirubin, Direct: 0.1 mg/dL (ref 0.0–0.3)
Total Bilirubin: 0.5 mg/dL (ref 0.3–1.2)

## 2012-04-22 NOTE — Telephone Encounter (Signed)
Will fax labs toDr Deterding

## 2012-04-27 ENCOUNTER — Encounter: Payer: Self-pay | Admitting: Internal Medicine

## 2012-04-27 ENCOUNTER — Ambulatory Visit (INDEPENDENT_AMBULATORY_CARE_PROVIDER_SITE_OTHER): Payer: BC Managed Care – PPO | Admitting: Internal Medicine

## 2012-04-27 VITALS — BP 160/98 | HR 80 | Temp 98.2°F | Resp 16 | Ht 68.0 in | Wt 231.0 lb

## 2012-04-27 DIAGNOSIS — Z0000 Encounter for general adult medical examination without abnormal findings: Secondary | ICD-10-CM

## 2012-04-27 DIAGNOSIS — N189 Chronic kidney disease, unspecified: Secondary | ICD-10-CM

## 2012-04-27 DIAGNOSIS — I10 Essential (primary) hypertension: Secondary | ICD-10-CM

## 2012-04-27 DIAGNOSIS — M109 Gout, unspecified: Secondary | ICD-10-CM

## 2012-04-27 DIAGNOSIS — M329 Systemic lupus erythematosus, unspecified: Secondary | ICD-10-CM

## 2012-04-27 DIAGNOSIS — E785 Hyperlipidemia, unspecified: Secondary | ICD-10-CM

## 2012-04-27 MED ORDER — ATORVASTATIN CALCIUM 40 MG PO TABS: 40.0000 mg | ORAL_TABLET | Freq: Every day | ORAL | Status: DC

## 2012-04-27 MED ORDER — CALCITRIOL 0.5 MCG PO CAPS: 0.5000 ug | ORAL_CAPSULE | Freq: Every day | ORAL | Status: DC

## 2012-04-27 MED ORDER — AMLODIPINE BESYLATE 5 MG PO TABS: 5.0000 mg | ORAL_TABLET | Freq: Every day | ORAL | Status: DC

## 2012-04-27 MED ORDER — LABETALOL HCL 300 MG PO TABS: 300.0000 mg | ORAL_TABLET | Freq: Two times a day (BID) | ORAL | Status: DC

## 2012-04-27 MED ORDER — MYCOPHENOLATE MOFETIL 500 MG PO TABS: 500.0000 mg | ORAL_TABLET | Freq: Two times a day (BID) | ORAL | Status: DC

## 2012-04-27 MED ORDER — HYDROXYCHLOROQUINE SULFATE 200 MG PO TABS: 200.0000 mg | ORAL_TABLET | Freq: Two times a day (BID) | ORAL | Status: DC

## 2012-04-27 MED ORDER — FUROSEMIDE 40 MG PO TABS: 80.0000 mg | ORAL_TABLET | Freq: Every day | ORAL | Status: DC

## 2012-04-27 MED ORDER — LISINOPRIL 20 MG PO TABS: ORAL_TABLET | ORAL | Status: DC

## 2012-04-27 NOTE — Progress Notes (Signed)
Patient ID: Tyler Franco, male   DOB: Oct 23, 1966, 46 y.o.   MRN: [id number]  Subjective:    Patient ID: Tyler Franco, male    DOB: 15-Jan-1966, 46 y.o.   MRN: [id number]  HPI The patient is here for a wellness exam. The patient has been doing well overall without major physical or psychological issues going on lately.  The patient needs to address  chronic hypertension that has not been well controlled with medicines; to address chronic lupus, hyperlipidemia controlled with medicines as well; and to address CRF, gout, controlled with medical treatment and diet prior.  BP Readings from Last 3 Encounters:  04/27/12 160/98  04/08/12 147/96  03/08/12 157/104   Wt Readings from Last 3 Encounters:  04/27/12 231 lb (104.781 kg)  12/22/11 237 lb 12 oz (107.843 kg)  11/24/11 236 lb (107.049 kg)       Review of Systems  Constitutional: Positive for fatigue. Negative for appetite change and unexpected weight change.  HENT: Negative for nosebleeds, congestion, sore throat, sneezing, trouble swallowing and neck pain.   Eyes: Negative for itching and visual disturbance.  Respiratory: Negative for cough.   Cardiovascular: Negative for chest pain, palpitations and leg swelling.  Gastrointestinal: Negative for nausea, diarrhea, blood in stool and abdominal distention.  Genitourinary: Negative for frequency and hematuria.  Musculoskeletal: Negative for back pain, joint swelling and gait problem.  Skin: Positive for rash.  Neurological: Negative for dizziness, tremors, speech difficulty and weakness.  Psychiatric/Behavioral: Negative for suicidal ideas, confusion, disturbed wake/sleep cycle, dysphoric mood and agitation. The patient is not nervous/anxious.        Objective:   Physical Exam  Constitutional: He is oriented to person, place, and time. He appears well-developed.       Obese  HENT:  Mouth/Throat: Oropharynx is clear and moist.  Eyes: Conjunctivae are normal. Pupils are equal,  round, and reactive to light.  Neck: Normal range of motion. No JVD present. No thyromegaly present.  Cardiovascular: Normal rate, regular rhythm, normal heart sounds and intact distal pulses.  Exam reveals no gallop and no friction rub.   No murmur heard. Pulmonary/Chest: Effort normal and breath sounds normal. No respiratory distress. He has no wheezes. He has no rales. He exhibits no tenderness.  Abdominal: Soft. Bowel sounds are normal. He exhibits no distension and no mass. There is no tenderness. There is no rebound and no guarding.  Musculoskeletal: Normal range of motion. He exhibits no edema and no tenderness.  Lymphadenopathy:    He has no cervical adenopathy.  Neurological: He is alert and oriented to person, place, and time. He has normal reflexes. No cranial nerve deficit. He exhibits normal muscle tone. Coordination normal.  Skin: Skin is warm and dry. Rash (eczema, mild; no new lupus lesions) noted.  Psychiatric: He has a normal mood and affect. His behavior is normal. Judgment and thought content normal.    Lab Results  Component Value Date   WBC 6.1 04/22/2012   HGB 9.0* 04/22/2012   HCT 27.9* 04/22/2012   PLT 213.0 04/22/2012   CHOL 121 04/22/2012   TRIG 91.0 04/22/2012   HDL 35.00* 04/22/2012   LDLDIRECT 122.3 04/28/2007   ALT 22 04/22/2012   AST 19 04/22/2012   NA 142 04/22/2012   K 4.4 04/22/2012   CL 114* 04/22/2012   CREATININE 6.6* 04/22/2012   BUN 58* 04/22/2012   CO2 20 04/22/2012   TSH 2.31 06/05/2011   PSA 1.14 04/22/2012  Assessment & Plan:

## 2012-04-27 NOTE — Assessment & Plan Note (Signed)
Worse Labs were faxed to Dr Deterding - new appt is pending

## 2012-04-27 NOTE — Assessment & Plan Note (Signed)
Continue with current prescription therapy as reflected on the Med list.  

## 2012-04-27 NOTE — Assessment & Plan Note (Signed)
We discussed age appropriate health related issues, including available/recomended screening tests and vaccinations. We discussed a need for adhering to healthy diet and exercise. Labs were reviewed/ordered. All questions were answered.   

## 2012-04-27 NOTE — Assessment & Plan Note (Signed)
Will increase Normodyne dose Continue with current prescription therapy as reflected on the Med list.

## 2012-05-06 ENCOUNTER — Ambulatory Visit (HOSPITAL_BASED_OUTPATIENT_CLINIC_OR_DEPARTMENT_OTHER): Payer: BC Managed Care – PPO

## 2012-05-06 ENCOUNTER — Ambulatory Visit: Payer: BC Managed Care – PPO

## 2012-05-06 ENCOUNTER — Other Ambulatory Visit (HOSPITAL_BASED_OUTPATIENT_CLINIC_OR_DEPARTMENT_OTHER): Payer: BC Managed Care – PPO | Admitting: Lab

## 2012-05-06 ENCOUNTER — Other Ambulatory Visit: Payer: BC Managed Care – PPO | Admitting: Lab

## 2012-05-06 VITALS — BP 177/105 | HR 83 | Temp 97.1°F

## 2012-05-06 DIAGNOSIS — D631 Anemia in chronic kidney disease: Secondary | ICD-10-CM

## 2012-05-06 DIAGNOSIS — N189 Chronic kidney disease, unspecified: Secondary | ICD-10-CM

## 2012-05-06 DIAGNOSIS — D649 Anemia, unspecified: Secondary | ICD-10-CM

## 2012-05-06 LAB — CBC WITH DIFFERENTIAL (CANCER CENTER ONLY)
BASO#: 0.1 10*3/uL (ref 0.0–0.2)
EOS%: 4.2 % (ref 0.0–7.0)
Eosinophils Absolute: 0.3 10*3/uL (ref 0.0–0.5)
HCT: 27.2 % — ABNORMAL LOW (ref 38.7–49.9)
HGB: 8.6 g/dL — ABNORMAL LOW (ref 13.0–17.1)
LYMPH%: 25.2 % (ref 14.0–48.0)
MCH: 29 pg (ref 28.0–33.4)
MCHC: 31.6 g/dL — ABNORMAL LOW (ref 32.0–35.9)
MCV: 92 fL (ref 82–98)
MONO%: 11.9 % (ref 0.0–13.0)
NEUT#: 3.6 10*3/uL (ref 1.5–6.5)
NEUT%: 57.9 % (ref 40.0–80.0)

## 2012-05-06 MED ORDER — DARBEPOETIN ALFA-POLYSORBATE 300 MCG/0.6ML IJ SOLN
300.0000 ug | Freq: Once | INTRAMUSCULAR | Status: AC
  Administered 2012-05-06: 300 ug via SUBCUTANEOUS

## 2012-05-06 NOTE — Progress Notes (Signed)
3:27 PM 05/06/2012 Left note for Dr. Myna Hidalgo regarding patient's concern for decreasing HGB. Teola Bradley, Treven Holtman [employer]

## 2012-05-06 NOTE — Patient Instructions (Signed)
Darbepoetin Alfa injection What is this medicine? DARBEPOETIN ALFA (dar be POE e tin AL fa) helps your body make more red blood cells. It is used to treat anemia caused by chronic kidney failure and chemotherapy. This medicine may be used for other purposes; ask your health care provider or pharmacist if you have questions. What should I tell my health care provider before I take this medicine? They need to know if you have any of these conditions: -blood clotting disorders or history of blood clots -cancer patient not on chemotherapy -cystic fibrosis -heart disease, such as angina, heart failure, or a history of a heart attack -hemoglobin level of 12 g/dL or greater -high blood pressure -low levels of folate, iron, or vitamin B12 -seizures -an unusual or allergic reaction to darbepoetin, erythropoietin, albumin, hamster proteins, latex, other medicines, foods, dyes, or preservatives -pregnant or trying to get pregnant -breast-feeding How should I use this medicine? This medicine is for injection into a vein or under the skin. It is usually given by a health care professional in a hospital or clinic setting. If you get this medicine at home, you will be taught how to prepare and give this medicine. Do not shake the solution before you withdraw a dose. Use exactly as directed. Take your medicine at regular intervals. Do not take your medicine more often than directed. It is important that you put your used needles and syringes in a special sharps container. Do not put them in a trash can. If you do not have a sharps container, call your pharmacist or healthcare provider to get one. Talk to your pediatrician regarding the use of this medicine in children. While this medicine may be used in children as young as 1 year for selected conditions, precautions do apply. Overdosage: If you think you have taken too much of this medicine contact a poison control center or emergency room at once. NOTE:  This medicine is only for you. Do not share this medicine with others. What if I miss a dose? If you miss a dose, take it as soon as you can. If it is almost time for your next dose, take only that dose. Do not take double or extra doses. What may interact with this medicine? Do not take this medicine with any of the following medications: -epoetin alfa This list may not describe all possible interactions. Give your health care provider a list of all the medicines, herbs, non-prescription drugs, or dietary supplements you use. Also tell them if you smoke, drink alcohol, or use illegal drugs. Some items may interact with your medicine. What should I watch for while using this medicine? Visit your prescriber or health care professional for regular checks on your progress and for the needed blood tests and blood pressure measurements. It is especially important for the doctor to make sure your hemoglobin level is in the desired range, to limit the risk of potential side effects and to give you the best benefit. Keep all appointments for any recommended tests. Check your blood pressure as directed. Ask your doctor what your blood pressure should be and when you should contact him or her. As your body makes more red blood cells, you may need to take iron, folic acid, or vitamin B supplements. Ask your doctor or health care provider which products are right for you. If you have kidney disease continue dietary restrictions, even though this medication can make you feel better. Talk with your doctor or health care professional about the   foods you eat and the vitamins that you take. What side effects may I notice from receiving this medicine? Side effects that you should report to your doctor or health care professional as soon as possible: -allergic reactions like skin rash, itching or hives, swelling of the face, lips, or tongue -breathing problems -changes in vision -chest pain -confusion, trouble speaking  or understanding -feeling faint or lightheaded, falls -high blood pressure -muscle aches or pains -pain, swelling, warmth in the leg -rapid weight gain -severe headaches -sudden numbness or weakness of the face, arm or leg -trouble walking, dizziness, loss of balance or coordination -seizures (convulsions) -swelling of the ankles, feet, hands -unusually weak or tired Side effects that usually do not require medical attention (report to your doctor or health care professional if they continue or are bothersome): -diarrhea -fever, chills (flu-like symptoms) -headaches -nausea, vomiting -redness, stinging, or swelling at site where injected This list may not describe all possible side effects. Call your doctor for medical advice about side effects. You may report side effects to FDA at 1-800-FDA-1088. Where should I keep my medicine? Keep out of the reach of children. Store in a refrigerator between 2 and 8 degrees C (36 and 46 degrees F). Do not freeze. Do not shake. Throw away any unused portion if using a single-dose vial. Throw away any unused medicine after the expiration date. NOTE: This sheet is a summary. It may not cover all possible information. If you have questions about this medicine, talk to your doctor, pharmacist, or health care provider.  2012, Elsevier/Gold Standard. (09/26/2008 10:23:57 AM) 

## 2012-05-10 ENCOUNTER — Ambulatory Visit: Payer: 59

## 2012-05-10 ENCOUNTER — Ambulatory Visit: Payer: 59 | Admitting: Hematology & Oncology

## 2012-05-10 ENCOUNTER — Other Ambulatory Visit: Payer: 59 | Admitting: Lab

## 2012-05-10 ENCOUNTER — Ambulatory Visit: Payer: Self-pay

## 2012-05-19 ENCOUNTER — Other Ambulatory Visit: Payer: Self-pay | Admitting: Hematology & Oncology

## 2012-05-19 DIAGNOSIS — D631 Anemia in chronic kidney disease: Secondary | ICD-10-CM

## 2012-05-19 DIAGNOSIS — M329 Systemic lupus erythematosus, unspecified: Secondary | ICD-10-CM

## 2012-05-19 DIAGNOSIS — N184 Chronic kidney disease, stage 4 (severe): Secondary | ICD-10-CM

## 2012-05-19 DIAGNOSIS — D509 Iron deficiency anemia, unspecified: Secondary | ICD-10-CM

## 2012-05-20 ENCOUNTER — Ambulatory Visit: Payer: BC Managed Care – PPO | Admitting: Hematology & Oncology

## 2012-05-20 ENCOUNTER — Other Ambulatory Visit: Payer: BC Managed Care – PPO | Admitting: Lab

## 2012-05-20 ENCOUNTER — Telehealth: Payer: Self-pay | Admitting: Hematology & Oncology

## 2012-05-20 ENCOUNTER — Ambulatory Visit: Payer: BC Managed Care – PPO

## 2012-05-20 NOTE — Telephone Encounter (Signed)
Pt called said he couldn't come in today. He made 7-29 appointment. MD aware. Pt was upset that he couldn't just get an injection with out seeing the MD.

## 2012-05-24 ENCOUNTER — Ambulatory Visit (HOSPITAL_BASED_OUTPATIENT_CLINIC_OR_DEPARTMENT_OTHER): Payer: BC Managed Care – PPO

## 2012-05-24 ENCOUNTER — Ambulatory Visit (HOSPITAL_BASED_OUTPATIENT_CLINIC_OR_DEPARTMENT_OTHER): Payer: BC Managed Care – PPO | Admitting: Hematology & Oncology

## 2012-05-24 ENCOUNTER — Other Ambulatory Visit (HOSPITAL_BASED_OUTPATIENT_CLINIC_OR_DEPARTMENT_OTHER): Payer: BC Managed Care – PPO | Admitting: Lab

## 2012-05-24 VITALS — BP 160/96 | HR 80 | Temp 97.0°F | Ht 68.0 in | Wt 229.0 lb

## 2012-05-24 DIAGNOSIS — D509 Iron deficiency anemia, unspecified: Secondary | ICD-10-CM

## 2012-05-24 DIAGNOSIS — D649 Anemia, unspecified: Secondary | ICD-10-CM

## 2012-05-24 DIAGNOSIS — N189 Chronic kidney disease, unspecified: Secondary | ICD-10-CM

## 2012-05-24 DIAGNOSIS — D631 Anemia in chronic kidney disease: Secondary | ICD-10-CM

## 2012-05-24 DIAGNOSIS — M329 Systemic lupus erythematosus, unspecified: Secondary | ICD-10-CM

## 2012-05-24 DIAGNOSIS — N289 Disorder of kidney and ureter, unspecified: Secondary | ICD-10-CM

## 2012-05-24 DIAGNOSIS — N184 Chronic kidney disease, stage 4 (severe): Secondary | ICD-10-CM

## 2012-05-24 LAB — CBC WITH DIFFERENTIAL (CANCER CENTER ONLY)
BASO%: 0.6 % (ref 0.0–2.0)
HCT: 29.5 % — ABNORMAL LOW (ref 38.7–49.9)
LYMPH%: 17.5 % (ref 14.0–48.0)
MCH: 28.2 pg (ref 28.0–33.4)
MCHC: 31.2 g/dL — ABNORMAL LOW (ref 32.0–35.9)
MCV: 91 fL (ref 82–98)
MONO%: 12.3 % (ref 0.0–13.0)
NEUT%: 64.8 % (ref 40.0–80.0)
Platelets: 175 10*3/uL (ref 145–400)
RDW: 14 % (ref 11.1–15.7)

## 2012-05-24 LAB — FERRITIN: Ferritin: 207 ng/mL (ref 22–322)

## 2012-05-24 LAB — RETICULOCYTES (CHCC)
ABS Retic: 34.1 10*3/uL (ref 19.0–186.0)
RBC.: 3.41 MIL/uL — ABNORMAL LOW (ref 4.22–5.81)

## 2012-05-24 LAB — IRON AND TIBC: Iron: 36 ug/dL — ABNORMAL LOW (ref 42–165)

## 2012-05-24 MED ORDER — DARBEPOETIN ALFA-POLYSORBATE 300 MCG/0.6ML IJ SOLN
300.0000 ug | Freq: Once | INTRAMUSCULAR | Status: AC
  Administered 2012-05-24: 300 ug via SUBCUTANEOUS

## 2012-05-24 NOTE — Progress Notes (Signed)
This office note has been dictated.

## 2012-05-25 ENCOUNTER — Telehealth: Payer: Self-pay | Admitting: Internal Medicine

## 2012-05-25 NOTE — Telephone Encounter (Signed)
Ok both Thx 

## 2012-05-25 NOTE — Telephone Encounter (Signed)
Caller: Jakell/Patient; PCP: Plotnikov, Alex; CB#: [phone];  Call regarding Seen in Office 04/27/12 for Physical/ Switching to new Nephrologist and Advised To Scheduled Appnt in the Office Wiithin One Month To Have Labwork Done/ Check Kidney Function. Last seen by Nephrology on 04/30/12 but kidneys worse and needs appnt next month. Transferred to Scheduling for Appnt.

## 2012-05-25 NOTE — Progress Notes (Signed)
CC:   James L. Deterding, M.D. Georgina Quint. Plotnikov, MD  DIAGNOSES: 1. Anemia of renal insufficiency. 2. Systemic lupus. 3. Immune thrombocytopenia, clinical remission.  CURRENT THERAPY:  Aranesp 300 mcg subcu q.3 weeks for hemoglobin less than 10.  INTERIM HISTORY:  Tyler Franco comes in for followup.  We last saw him back in February.  Unfortunately, his kidneys might be getting a little bit worse.  His hemoglobin is definitely down.  It was 8.6 a week or so ago.  He has moved to [facility] now.  He is married.  He is thinking about seeing a nephrologist down in [place] just for convenience.  He has had no bleeding.  He has had no rashes.  He has had no "flare- ups" of his lupus.  He still is on Plaquenil.  PHYSICAL EXAMINATION:  This is a well-developed, well-nourished African American gentleman in no obvious distress.  Vital signs:  Temperature of 97, pulse 80, respiratory rate 18, blood pressure 160/96, weight is 229. Head and neck:  Normocephalic, atraumatic skull.  Head and neck exam shows no ocular or oral lesions.  There is no scleral icterus.  He has no adenopathy in his neck.  Lungs:  Clear bilaterally.  Cardiac: Regular rate and rhythm with normal S1 and S2.  There are no murmurs, rubs or bruits.  Abdomen:  Soft with good bowel sounds.  There is no palpable abdominal mass.  There is no palpable hepatosplenomegaly. Back:  No tenderness of the spine, ribs, or hips.  Extremities:  No clubbing, cyanosis or edema.  Skin:  No rashes.  LABORATORY STUDIES:  White cell count is 6.9, hemoglobin 9.2, hematocrit 29.5, platelet count 175.  MCV is 91.  IMPRESSION:  Tyler Franco is a 46 year old African American gentleman with anemia of renal insufficiency.  When we last checked his iron studies, his ferritin was 310 with an iron saturation of 41%.  We will have to watch out for this.  I will see him back myself in 6 weeks' time.  He will come back in 3 weeks for Aranesp if  his hemoglobin is less than 10.  It is certainly possible that we may have to give some iron at some point.    ______________________________ Josph Macho, M.D. PRE/MEDQ  D:  05/24/2012  T:  05/25/2012  Job:  [id number]

## 2012-05-25 NOTE — Telephone Encounter (Signed)
Left message for pt to call back and schedule

## 2012-05-27 ENCOUNTER — Other Ambulatory Visit: Payer: Self-pay | Admitting: *Deleted

## 2012-05-27 ENCOUNTER — Telehealth: Payer: Self-pay | Admitting: Hematology & Oncology

## 2012-05-27 ENCOUNTER — Telehealth: Payer: Self-pay | Admitting: *Deleted

## 2012-05-27 DIAGNOSIS — D649 Anemia, unspecified: Secondary | ICD-10-CM

## 2012-05-27 NOTE — Progress Notes (Signed)
Patient had a few questions for you about how his insurance would be billed.

## 2012-05-27 NOTE — Telephone Encounter (Signed)
RN spoke with patient and order to sch Iron appt.  i sch Iron appt with patient for 05/31/12

## 2012-05-27 NOTE — Telephone Encounter (Signed)
Message copied by Anselm Jungling on Thu May 27, 2012  9:52 AM ------      Message from: Arlan Organ R      Created: Mon May 24, 2012  5:25 PM       Please call him and tell him that his iron level is low.  He REALLY needs IV iron to help get his blood better.  Please set up Feraheme 1020mg  x 1 dose 1-2 weeks!!!  Pete E.

## 2012-05-27 NOTE — Telephone Encounter (Signed)
Called patient to let him know that his iron levels are very low.  He really needs IV iron . Feraheme 1020 mg IV x 1 dose.  Call transferred to scheduling

## 2012-05-31 ENCOUNTER — Telehealth: Payer: Self-pay | Admitting: Internal Medicine

## 2012-05-31 ENCOUNTER — Ambulatory Visit (HOSPITAL_BASED_OUTPATIENT_CLINIC_OR_DEPARTMENT_OTHER): Payer: BC Managed Care – PPO

## 2012-05-31 DIAGNOSIS — D649 Anemia, unspecified: Secondary | ICD-10-CM

## 2012-05-31 MED ORDER — SODIUM CHLORIDE 0.9 % IV SOLN
1020.0000 mg | Freq: Once | INTRAVENOUS | Status: AC
  Administered 2012-05-31: 1020 mg via INTRAVENOUS
  Filled 2012-05-31: qty 34

## 2012-05-31 MED ORDER — MYCOPHENOLATE MOFETIL 500 MG PO TABS: 500.0000 mg | ORAL_TABLET | Freq: Two times a day (BID) | ORAL | Status: DC

## 2012-05-31 MED ORDER — ATORVASTATIN CALCIUM 40 MG PO TABS: 40.0000 mg | ORAL_TABLET | Freq: Every day | ORAL | Status: DC

## 2012-05-31 MED ORDER — LISINOPRIL 20 MG PO TABS: ORAL_TABLET | ORAL | Status: DC

## 2012-05-31 MED ORDER — AMLODIPINE BESYLATE 5 MG PO TABS: 5.0000 mg | ORAL_TABLET | Freq: Every day | ORAL | Status: DC

## 2012-05-31 MED ORDER — LABETALOL HCL 300 MG PO TABS: 300.0000 mg | ORAL_TABLET | Freq: Two times a day (BID) | ORAL | Status: DC

## 2012-05-31 MED ORDER — FUROSEMIDE 40 MG PO TABS: 80.0000 mg | ORAL_TABLET | Freq: Every day | ORAL | Status: DC

## 2012-05-31 NOTE — Telephone Encounter (Signed)
Done. Left detailed mess informing pt.  

## 2012-05-31 NOTE — Patient Instructions (Signed)
Ferumoxytol injection What is this medicine? FERUMOXYTOL is an iron complex. Iron is used to make healthy red blood cells, which carry oxygen and nutrients throughout the body. This medicine is used to treat iron deficiency anemia in people with chronic kidney disease. This medicine may be used for other purposes; ask your health care provider or pharmacist if you have questions. What should I tell my health care provider before I take this medicine? They need to know if you have any of these conditions: -anemia not caused by low iron levels -high levels of iron in the blood -magnetic resonance imaging (MRI) test scheduled -an unusual or allergic reaction to iron, other medicines, foods, dyes, or preservatives -pregnant or trying to get pregnant -breast-feeding How should I use this medicine? This medicine is for infusion into a vein. It is given by a health care professional in a hospital or clinic setting. Talk to your pediatrician regarding the use of this medicine in children. Special care may be needed. Overdosage: If you think you've taken too much of this medicine contact a poison control center or emergency room at once. Overdosage: If you think you have taken too much of this medicine contact a poison control center or emergency room at once. NOTE: This medicine is only for you. Do not share this medicine with others. What if I miss a dose? It is important not to miss your dose. Call your doctor or health care professional if you are unable to keep an appointment. What may interact with this medicine? This medicine may interact with the following medications: -other iron products This list may not describe all possible interactions. Give your health care provider a list of all the medicines, herbs, non-prescription drugs, or dietary supplements you use. Also tell them if you smoke, drink alcohol, or use illegal drugs. Some items may interact with your medicine. What should I watch  for while using this medicine? Visit your doctor or healthcare professional regularly. Tell your doctor or healthcare professional if your symptoms do not start to get better or if they get worse. You may need blood work done while you are taking this medicine. You may need to follow a special diet. Talk to your doctor. Foods that contain iron include: whole grains/cereals, dried fruits, beans, or peas, leafy green vegetables, and organ meats (liver, kidney). What side effects may I notice from receiving this medicine? Side effects that you should report to your doctor or health care professional as soon as possible: -allergic reactions like skin rash, itching or hives, swelling of the face, lips, or tongue -breathing problems -changes in blood pressure -feeling faint or lightheaded, falls -fever or chills -flushing, sweating, or hot feelings -swelling of the ankles or feet Side effects that usually do not require medical attention (Report these to your doctor or health care professional if they continue or are bothersome.): -diarrhea -headache -nausea, vomiting -stomach pain This list may not describe all possible side effects. Call your doctor for medical advice about side effects. You may report side effects to FDA at 1-800-FDA-1088. Where should I keep my medicine? This drug is given in a hospital or clinic and will not be stored at home. NOTE: This sheet is a summary. It may not cover all possible information. If you have questions about this medicine, talk to your doctor, pharmacist, or health care provider.  2012, Elsevier/Gold Standard. (07/05/2008 9:48:25 PM) 

## 2012-05-31 NOTE — Telephone Encounter (Signed)
Caller: Tyler Franco/Patient; PCP: Sonda Primes; CB#: [phone]; Call regarding Question About Prescriptions To Mail Order Pharmacy.  He was seen 04/27/12 by Dr. Posey Rea and did not send in 6 of the 8 prescriptions he was given.  Now he wants the office to call these into his mail order pharmacy  [facility] at [phone] for the following meds: Lisinopril, Mycophenolate, Labatolol, Lipitor, Lasix and Amlodipine.  Please call him when this has been completed.

## 2012-06-01 ENCOUNTER — Other Ambulatory Visit: Payer: Self-pay | Admitting: *Deleted

## 2012-06-01 ENCOUNTER — Other Ambulatory Visit (INDEPENDENT_AMBULATORY_CARE_PROVIDER_SITE_OTHER): Payer: BC Managed Care – PPO

## 2012-06-01 DIAGNOSIS — M329 Systemic lupus erythematosus, unspecified: Secondary | ICD-10-CM

## 2012-06-01 DIAGNOSIS — N189 Chronic kidney disease, unspecified: Secondary | ICD-10-CM

## 2012-06-01 DIAGNOSIS — M109 Gout, unspecified: Secondary | ICD-10-CM

## 2012-06-01 DIAGNOSIS — E785 Hyperlipidemia, unspecified: Secondary | ICD-10-CM

## 2012-06-01 DIAGNOSIS — I10 Essential (primary) hypertension: Secondary | ICD-10-CM

## 2012-06-01 LAB — CBC WITH DIFFERENTIAL/PLATELET
Basophils Absolute: 0 10*3/uL (ref 0.0–0.1)
Lymphocytes Relative: 22.1 % (ref 12.0–46.0)
Monocytes Relative: 12.9 % — ABNORMAL HIGH (ref 3.0–12.0)
Neutrophils Relative %: 59 % (ref 43.0–77.0)
Platelets: 228 10*3/uL (ref 150.0–400.0)
RDW: 15.4 % — ABNORMAL HIGH (ref 11.5–14.6)

## 2012-06-01 LAB — BASIC METABOLIC PANEL
BUN: 57 mg/dL — ABNORMAL HIGH (ref 6–23)
Calcium: 9 mg/dL (ref 8.4–10.5)
Creatinine, Ser: 6.6 mg/dL (ref 0.4–1.5)
GFR: 11.86 mL/min — CL (ref >60.00)
Glucose, Bld: 84 mg/dL (ref 70–99)
Sodium: 140 mEq/L (ref 135–145)

## 2012-06-01 MED ORDER — MYCOPHENOLATE MOFETIL 500 MG PO TABS: 500.0000 mg | ORAL_TABLET | Freq: Two times a day (BID) | ORAL | Status: DC

## 2012-06-07 ENCOUNTER — Other Ambulatory Visit: Payer: 59 | Admitting: Lab

## 2012-06-07 ENCOUNTER — Ambulatory Visit: Payer: 59

## 2012-06-08 ENCOUNTER — Ambulatory Visit: Payer: BC Managed Care – PPO | Admitting: Internal Medicine

## 2012-06-10 ENCOUNTER — Ambulatory Visit (INDEPENDENT_AMBULATORY_CARE_PROVIDER_SITE_OTHER): Payer: BC Managed Care – PPO | Admitting: Internal Medicine

## 2012-06-10 ENCOUNTER — Other Ambulatory Visit (HOSPITAL_BASED_OUTPATIENT_CLINIC_OR_DEPARTMENT_OTHER): Payer: BC Managed Care – PPO | Admitting: Lab

## 2012-06-10 ENCOUNTER — Ambulatory Visit (HOSPITAL_BASED_OUTPATIENT_CLINIC_OR_DEPARTMENT_OTHER): Payer: BC Managed Care – PPO

## 2012-06-10 ENCOUNTER — Encounter: Payer: Self-pay | Admitting: Internal Medicine

## 2012-06-10 VITALS — BP 165/98 | HR 64 | Temp 97.0°F | Resp 20

## 2012-06-10 VITALS — BP 154/88 | HR 65 | Temp 97.6°F | Resp 16 | Wt 226.2 lb

## 2012-06-10 DIAGNOSIS — N289 Disorder of kidney and ureter, unspecified: Secondary | ICD-10-CM

## 2012-06-10 DIAGNOSIS — N189 Chronic kidney disease, unspecified: Secondary | ICD-10-CM

## 2012-06-10 DIAGNOSIS — D649 Anemia, unspecified: Secondary | ICD-10-CM

## 2012-06-10 DIAGNOSIS — D509 Iron deficiency anemia, unspecified: Secondary | ICD-10-CM

## 2012-06-10 DIAGNOSIS — I10 Essential (primary) hypertension: Secondary | ICD-10-CM

## 2012-06-10 DIAGNOSIS — D631 Anemia in chronic kidney disease: Secondary | ICD-10-CM

## 2012-06-10 LAB — CBC WITH DIFFERENTIAL (CANCER CENTER ONLY)
BASO#: 0 10*3/uL (ref 0.0–0.2)
EOS%: 4.8 % (ref 0.0–7.0)
LYMPH%: 15.4 % (ref 14.0–48.0)
MCH: 28.7 pg (ref 28.0–33.4)
MCHC: 31.8 g/dL — ABNORMAL LOW (ref 32.0–35.9)
MCV: 90 fL (ref 82–98)
MONO%: 13.7 % — ABNORMAL HIGH (ref 0.0–13.0)
NEUT#: 4.3 10*3/uL (ref 1.5–6.5)
Platelets: 179 10*3/uL (ref 145–400)

## 2012-06-10 LAB — IRON AND TIBC
%SAT: 25 % (ref 20–55)
Iron: 53 ug/dL (ref 42–165)
TIBC: 208 ug/dL — ABNORMAL LOW (ref 215–435)

## 2012-06-10 MED ORDER — LABETALOL HCL 300 MG PO TABS: 300.0000 mg | ORAL_TABLET | Freq: Three times a day (TID) | ORAL | Status: DC

## 2012-06-10 MED ORDER — DARBEPOETIN ALFA-POLYSORBATE 300 MCG/0.6ML IJ SOLN
300.0000 ug | Freq: Once | INTRAMUSCULAR | Status: AC
  Administered 2012-06-10: 300 ug via SUBCUTANEOUS

## 2012-06-10 NOTE — Assessment & Plan Note (Signed)
Chronic 2013 -worse He is switching to [facility] due to his ins  Labs reviewed

## 2012-06-10 NOTE — Assessment & Plan Note (Signed)
Continue with current prescription therapy as reflected on the Med list; use Normodyne tid if BP is up

## 2012-06-10 NOTE — Progress Notes (Signed)
   Subjective:    Patient ID: Tyler Franco, male    DOB: July 24, 1966, 46 y.o.   MRN: [id number]  HPI   The patient needs to address  chronic hypertension that has not been well controlled with medicines; to address chronic lupus, hyperlipidemia controlled with medicines as well; and to address CRF, gout, controlled with medical treatment and diet prior. He has to switch to [facility] now...  BP Readings from Last 3 Encounters:  06/10/12 154/88  06/10/12 165/98  05/24/12 160/96   Wt Readings from Last 3 Encounters:  06/10/12 226 lb 4 oz (102.626 kg)  05/24/12 229 lb (103.874 kg)  04/27/12 231 lb (104.781 kg)       Review of Systems  Constitutional: Positive for fatigue. Negative for appetite change and unexpected weight change.  HENT: Negative for nosebleeds, congestion, sore throat, sneezing, trouble swallowing and neck pain.   Eyes: Negative for itching and visual disturbance.  Respiratory: Negative for cough.   Cardiovascular: Negative for chest pain, palpitations and leg swelling.  Gastrointestinal: Negative for nausea, diarrhea, blood in stool and abdominal distention.  Genitourinary: Negative for frequency and hematuria.  Musculoskeletal: Negative for back pain, joint swelling and gait problem.  Skin: Positive for rash.  Neurological: Negative for dizziness, tremors, speech difficulty and weakness.  Psychiatric/Behavioral: Negative for suicidal ideas, confusion, disturbed wake/sleep cycle, dysphoric mood and agitation. The patient is not nervous/anxious.        Objective:   Physical Exam  Constitutional: He is oriented to person, place, and time. He appears well-developed.       Obese  HENT:  Mouth/Throat: Oropharynx is clear and moist.  Eyes: Conjunctivae are normal. Pupils are equal, round, and reactive to light.  Neck: Normal range of motion. No JVD present. No thyromegaly present.  Cardiovascular: Normal rate, regular rhythm, normal heart sounds and  intact distal pulses.  Exam reveals no gallop and no friction rub.   No murmur heard. Pulmonary/Chest: Effort normal and breath sounds normal. No respiratory distress. He has no wheezes. He has no rales. He exhibits no tenderness.  Abdominal: Soft. Bowel sounds are normal. He exhibits no distension and no mass. There is no tenderness. There is no rebound and no guarding.  Musculoskeletal: Normal range of motion. He exhibits no edema and no tenderness.  Lymphadenopathy:    He has no cervical adenopathy.  Neurological: He is alert and oriented to person, place, and time. He has normal reflexes. No cranial nerve deficit. He exhibits normal muscle tone. Coordination normal.  Skin: Skin is warm and dry. Rash (eczema, mild; no new lupus lesions) noted.  Psychiatric: He has a normal mood and affect. His behavior is normal. Judgment and thought content normal.    Lab Results  Component Value Date   WBC 6.5 06/10/2012   HGB 10.2* 06/10/2012   HCT 32.1* 06/10/2012   PLT 179 06/10/2012   CHOL 121 04/22/2012   TRIG 91.0 04/22/2012   HDL 35.00* 04/22/2012   LDLDIRECT 122.3 04/28/2007   ALT 22 04/22/2012   AST 19 04/22/2012   NA 140 06/01/2012   K 4.5 06/01/2012   CL 112 06/01/2012   CREATININE 6.6* 06/01/2012   BUN 57* 06/01/2012   CO2 20 06/01/2012   TSH 2.31 06/05/2011   PSA 1.14 04/22/2012       Assessment & Plan:

## 2012-06-10 NOTE — Patient Instructions (Signed)
Darbepoetin Alfa injection What is this medicine? DARBEPOETIN ALFA (dar be POE e tin AL fa) helps your body make more red blood cells. It is used to treat anemia caused by chronic kidney failure and chemotherapy. This medicine may be used for other purposes; ask your health care provider or pharmacist if you have questions. What should I tell my health care provider before I take this medicine? They need to know if you have any of these conditions: -blood clotting disorders or history of blood clots -cancer patient not on chemotherapy -cystic fibrosis -heart disease, such as angina, heart failure, or a history of a heart attack -hemoglobin level of 12 g/dL or greater -high blood pressure -low levels of folate, iron, or vitamin B12 -seizures -an unusual or allergic reaction to darbepoetin, erythropoietin, albumin, hamster proteins, latex, other medicines, foods, dyes, or preservatives -pregnant or trying to get pregnant -breast-feeding How should I use this medicine? This medicine is for injection into a vein or under the skin. It is usually given by a health care professional in a hospital or clinic setting. If you get this medicine at home, you will be taught how to prepare and give this medicine. Do not shake the solution before you withdraw a dose. Use exactly as directed. Take your medicine at regular intervals. Do not take your medicine more often than directed. It is important that you put your used needles and syringes in a special sharps container. Do not put them in a trash can. If you do not have a sharps container, call your pharmacist or healthcare provider to get one. Talk to your pediatrician regarding the use of this medicine in children. While this medicine may be used in children as young as 1 year for selected conditions, precautions do apply. Overdosage: If you think you have taken too much of this medicine contact a poison control center or emergency room at once. NOTE:  This medicine is only for you. Do not share this medicine with others. What if I miss a dose? If you miss a dose, take it as soon as you can. If it is almost time for your next dose, take only that dose. Do not take double or extra doses. What may interact with this medicine? Do not take this medicine with any of the following medications: -epoetin alfa This list may not describe all possible interactions. Give your health care provider a list of all the medicines, herbs, non-prescription drugs, or dietary supplements you use. Also tell them if you smoke, drink alcohol, or use illegal drugs. Some items may interact with your medicine. What should I watch for while using this medicine? Visit your prescriber or health care professional for regular checks on your progress and for the needed blood tests and blood pressure measurements. It is especially important for the doctor to make sure your hemoglobin level is in the desired range, to limit the risk of potential side effects and to give you the best benefit. Keep all appointments for any recommended tests. Check your blood pressure as directed. Ask your doctor what your blood pressure should be and when you should contact him or her. As your body makes more red blood cells, you may need to take iron, folic acid, or vitamin B supplements. Ask your doctor or health care provider which products are right for you. If you have kidney disease continue dietary restrictions, even though this medication can make you feel better. Talk with your doctor or health care professional about the   foods you eat and the vitamins that you take. What side effects may I notice from receiving this medicine? Side effects that you should report to your doctor or health care professional as soon as possible: -allergic reactions like skin rash, itching or hives, swelling of the face, lips, or tongue -breathing problems -changes in vision -chest pain -confusion, trouble speaking  or understanding -feeling faint or lightheaded, falls -high blood pressure -muscle aches or pains -pain, swelling, warmth in the leg -rapid weight gain -severe headaches -sudden numbness or weakness of the face, arm or leg -trouble walking, dizziness, loss of balance or coordination -seizures (convulsions) -swelling of the ankles, feet, hands -unusually weak or tired Side effects that usually do not require medical attention (report to your doctor or health care professional if they continue or are bothersome): -diarrhea -fever, chills (flu-like symptoms) -headaches -nausea, vomiting -redness, stinging, or swelling at site where injected This list may not describe all possible side effects. Call your doctor for medical advice about side effects. You may report side effects to FDA at 1-800-FDA-1088. Where should I keep my medicine? Keep out of the reach of children. Store in a refrigerator between 2 and 8 degrees C (36 and 46 degrees F). Do not freeze. Do not shake. Throw away any unused portion if using a single-dose vial. Throw away any unused medicine after the expiration date. NOTE: This sheet is a summary. It may not cover all possible information. If you have questions about this medicine, talk to your doctor, pharmacist, or health care provider.  2012, Elsevier/Gold Standard. (09/26/2008 10:23:57 AM) 

## 2012-06-10 NOTE — Assessment & Plan Note (Signed)
Chronic - iron def and anemia of chronic disease On Aranesp and IV Iron

## 2012-06-23 ENCOUNTER — Other Ambulatory Visit: Payer: Self-pay

## 2012-07-05 ENCOUNTER — Other Ambulatory Visit: Payer: 59 | Admitting: Lab

## 2012-07-05 ENCOUNTER — Ambulatory Visit: Payer: 59

## 2012-07-06 ENCOUNTER — Other Ambulatory Visit (HOSPITAL_BASED_OUTPATIENT_CLINIC_OR_DEPARTMENT_OTHER): Payer: BC Managed Care – PPO | Admitting: Lab

## 2012-07-06 ENCOUNTER — Ambulatory Visit (HOSPITAL_BASED_OUTPATIENT_CLINIC_OR_DEPARTMENT_OTHER): Payer: BC Managed Care – PPO

## 2012-07-06 ENCOUNTER — Ambulatory Visit (HOSPITAL_BASED_OUTPATIENT_CLINIC_OR_DEPARTMENT_OTHER): Payer: BC Managed Care – PPO | Admitting: Hematology & Oncology

## 2012-07-06 ENCOUNTER — Telehealth: Payer: Self-pay | Admitting: Hematology & Oncology

## 2012-07-06 VITALS — BP 152/89 | HR 72 | Temp 97.7°F | Resp 20 | Ht 68.0 in | Wt 225.0 lb

## 2012-07-06 DIAGNOSIS — N189 Chronic kidney disease, unspecified: Secondary | ICD-10-CM

## 2012-07-06 DIAGNOSIS — D649 Anemia, unspecified: Secondary | ICD-10-CM

## 2012-07-06 DIAGNOSIS — D631 Anemia in chronic kidney disease: Secondary | ICD-10-CM

## 2012-07-06 DIAGNOSIS — N289 Disorder of kidney and ureter, unspecified: Secondary | ICD-10-CM

## 2012-07-06 DIAGNOSIS — M329 Systemic lupus erythematosus, unspecified: Secondary | ICD-10-CM

## 2012-07-06 DIAGNOSIS — N184 Chronic kidney disease, stage 4 (severe): Secondary | ICD-10-CM

## 2012-07-06 LAB — CBC WITH DIFFERENTIAL (CANCER CENTER ONLY)
BASO#: 0 10*3/uL (ref 0.0–0.2)
EOS%: 4 % (ref 0.0–7.0)
HGB: 10.9 g/dL — ABNORMAL LOW (ref 13.0–17.1)
MCH: 29 pg (ref 28.0–33.4)
MCHC: 32.3 g/dL (ref 32.0–35.9)
MONO%: 17.8 % — ABNORMAL HIGH (ref 0.0–13.0)
NEUT#: 2.6 10*3/uL (ref 1.5–6.5)

## 2012-07-06 MED ORDER — DARBEPOETIN ALFA-POLYSORBATE 300 MCG/0.6ML IJ SOLN
300.0000 ug | Freq: Once | INTRAMUSCULAR | Status: AC
  Administered 2012-07-06: 300 ug via SUBCUTANEOUS

## 2012-07-06 NOTE — Telephone Encounter (Signed)
Per MD ok for Pt to call and make appointments

## 2012-07-06 NOTE — Progress Notes (Signed)
CC:   Georgina Quint. Plotnikov, MD [facility] [employer] Deterding, M.D.  DIAGNOSES: 1. Anemia of renal insufficiency. 2. Systemic lupus. 3. Immune thrombocytopenia, clinical remission.  CURRENT THERAPY:  Aranesp 300 mcg subcu q.4 weeks as needed for a hemoglobin less than 11.  INTERIM HISTORY:  Mr. Willden comes in for his followup.  He now has a new job.  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  He has responded well to the Aranesp.  He has been staying on schedule for this.  He has had no problems with bleeding.  He has had no flare-ups of his lupus.  He has had no fevers, sweats, or chills.  When we last saw him in July, his ferritin was 634 with an iron saturation of 16%.  We did go ahead and give him a dose of iron at that time.  PHYSICAL EXAMINATION:  General:  This is a well-developed, well- nourished black gentleman in no obvious distress.  Vital Signs: Temperature 97.7, pulse 72, respiratory rate 18, blood pressure 152/89, weight is 225.  Head and Neck Exam:  Shows a normocephalic, atraumatic skull.  There are no ocular or oral lesions.  There is no scleral icterus.  There is no adenopathy in the neck.  Lungs:  Clear bilaterally.  Cardiac Exam:  Regular rate and rhythm with a normal S1 and S2.  There are no murmurs, rubs, or bruits.  Abdominal Exam:  Soft with good bowel sounds.  There is no palpable abdominal mass.  There is no fluid wave.  There is no palpable hepatosplenomegaly.  Back Exam:  No tenderness over the spine, ribs, or hips.  Extremities:  Show no clubbing, cyanosis, or edema.  LABORATORY STUDIES:  White cell count is 4.8, hemoglobin 10.9, hematocrit 33.7, platelet count 156.  MCV is 90.  IMPRESSION:  Mr. Huy is a 46 year old gentleman with anemia of renal insufficiency.  He has done well with the Aranesp.  He does require iron every now and then which also helps.  We will go ahead and give him a dose of Aranesp today.  He thinks that he will need this.  He is not  sure when he is going to be able to come back because of his training program.  Mr. Laker will let us know when he can come back.  As such, we will then plan to set him up with labs, injections, etc.    ______________________________ Josph Macho, M.D. PRE/MEDQ  D:  07/06/2012  T:  07/06/2012  Job:  [id number]

## 2012-07-06 NOTE — Progress Notes (Signed)
This office note has been dictated.

## 2012-07-28 ENCOUNTER — Ambulatory Visit: Payer: BC Managed Care – PPO | Admitting: Internal Medicine

## 2012-08-02 ENCOUNTER — Ambulatory Visit: Payer: 59

## 2012-08-02 ENCOUNTER — Other Ambulatory Visit: Payer: 59 | Admitting: Lab

## 2012-08-13 ENCOUNTER — Ambulatory Visit: Payer: BC Managed Care – PPO

## 2012-08-13 ENCOUNTER — Other Ambulatory Visit (HOSPITAL_BASED_OUTPATIENT_CLINIC_OR_DEPARTMENT_OTHER): Payer: BC Managed Care – PPO | Admitting: Lab

## 2012-08-13 DIAGNOSIS — D649 Anemia, unspecified: Secondary | ICD-10-CM

## 2012-08-13 DIAGNOSIS — N189 Chronic kidney disease, unspecified: Secondary | ICD-10-CM

## 2012-08-13 DIAGNOSIS — D631 Anemia in chronic kidney disease: Secondary | ICD-10-CM

## 2012-08-13 LAB — CBC WITH DIFFERENTIAL (CANCER CENTER ONLY)
BASO%: 0.3 % (ref 0.0–2.0)
EOS%: 3.9 % (ref 0.0–7.0)
Eosinophils Absolute: 0.3 10*3/uL (ref 0.0–0.5)
MCH: 28.2 pg (ref 28.0–33.4)
MCHC: 32.3 g/dL (ref 32.0–35.9)
MONO%: 8.3 % (ref 0.0–13.0)
NEUT#: 5.2 10*3/uL (ref 1.5–6.5)
Platelets: 133 10*3/uL — ABNORMAL LOW (ref 145–400)
RBC: 4.01 10*6/uL — ABNORMAL LOW (ref 4.20–5.70)
RDW: 13.7 % (ref 11.1–15.7)

## 2012-08-30 ENCOUNTER — Ambulatory Visit: Payer: 59

## 2012-08-30 ENCOUNTER — Other Ambulatory Visit: Payer: 59 | Admitting: Lab

## 2012-09-06 ENCOUNTER — Ambulatory Visit: Payer: BC Managed Care – PPO

## 2012-09-06 ENCOUNTER — Ambulatory Visit (HOSPITAL_BASED_OUTPATIENT_CLINIC_OR_DEPARTMENT_OTHER): Payer: BC Managed Care – PPO

## 2012-09-06 ENCOUNTER — Other Ambulatory Visit (HOSPITAL_BASED_OUTPATIENT_CLINIC_OR_DEPARTMENT_OTHER): Payer: BC Managed Care – PPO | Admitting: Lab

## 2012-09-06 VITALS — BP 147/84 | HR 71 | Temp 97.3°F | Resp 18

## 2012-09-06 DIAGNOSIS — D649 Anemia, unspecified: Secondary | ICD-10-CM

## 2012-09-06 DIAGNOSIS — N289 Disorder of kidney and ureter, unspecified: Secondary | ICD-10-CM

## 2012-09-06 DIAGNOSIS — N189 Chronic kidney disease, unspecified: Secondary | ICD-10-CM

## 2012-09-06 DIAGNOSIS — D631 Anemia in chronic kidney disease: Secondary | ICD-10-CM

## 2012-09-06 LAB — CBC WITH DIFFERENTIAL (CANCER CENTER ONLY)
BASO%: 0.4 % (ref 0.0–2.0)
Eosinophils Absolute: 0.2 10*3/uL (ref 0.0–0.5)
LYMPH#: 1.4 10*3/uL (ref 0.9–3.3)
MONO#: 0.8 10*3/uL (ref 0.1–0.9)
NEUT#: 5.2 10*3/uL (ref 1.5–6.5)
Platelets: 142 10*3/uL — ABNORMAL LOW (ref 145–400)
RBC: 3.31 10*6/uL — ABNORMAL LOW (ref 4.20–5.70)
WBC: 7.7 10*3/uL (ref 4.0–10.0)

## 2012-09-06 MED ORDER — DARBEPOETIN ALFA-POLYSORBATE 300 MCG/0.6ML IJ SOLN
300.0000 ug | Freq: Once | INTRAMUSCULAR | Status: AC
  Administered 2012-09-06: 300 ug via SUBCUTANEOUS

## 2012-09-06 NOTE — Patient Instructions (Signed)
Darbepoetin Alfa injection What is this medicine? DARBEPOETIN ALFA (dar be POE e tin AL fa) helps your body make more red blood cells. It is used to treat anemia caused by chronic kidney failure and chemotherapy. This medicine may be used for other purposes; ask your health care provider or pharmacist if you have questions. What should I tell my health care provider before I take this medicine? They need to know if you have any of these conditions: -blood clotting disorders or history of blood clots -cancer patient not on chemotherapy -cystic fibrosis -heart disease, such as angina, heart failure, or a history of a heart attack -hemoglobin level of 12 g/dL or greater -high blood pressure -low levels of folate, iron, or vitamin B12 -seizures -an unusual or allergic reaction to darbepoetin, erythropoietin, albumin, hamster proteins, latex, other medicines, foods, dyes, or preservatives -pregnant or trying to get pregnant -breast-feeding How should I use this medicine? This medicine is for injection into a vein or under the skin. It is usually given by a health care professional in a hospital or clinic setting. If you get this medicine at home, you will be taught how to prepare and give this medicine. Do not shake the solution before you withdraw a dose. Use exactly as directed. Take your medicine at regular intervals. Do not take your medicine more often than directed. It is important that you put your used needles and syringes in a special sharps container. Do not put them in a trash can. If you do not have a sharps container, call your pharmacist or healthcare provider to get one. Talk to your pediatrician regarding the use of this medicine in children. While this medicine may be used in children as young as 1 year for selected conditions, precautions do apply. Overdosage: If you think you have taken too much of this medicine contact a poison control center or emergency room at once. NOTE:  This medicine is only for you. Do not share this medicine with others. What if I miss a dose? If you miss a dose, take it as soon as you can. If it is almost time for your next dose, take only that dose. Do not take double or extra doses. What may interact with this medicine? Do not take this medicine with any of the following medications: -epoetin alfa This list may not describe all possible interactions. Give your health care provider a list of all the medicines, herbs, non-prescription drugs, or dietary supplements you use. Also tell them if you smoke, drink alcohol, or use illegal drugs. Some items may interact with your medicine. What should I watch for while using this medicine? Visit your prescriber or health care professional for regular checks on your progress and for the needed blood tests and blood pressure measurements. It is especially important for the doctor to make sure your hemoglobin level is in the desired range, to limit the risk of potential side effects and to give you the best benefit. Keep all appointments for any recommended tests. Check your blood pressure as directed. Ask your doctor what your blood pressure should be and when you should contact him or her. As your body makes more red blood cells, you may need to take iron, folic acid, or vitamin B supplements. Ask your doctor or health care provider which products are right for you. If you have kidney disease continue dietary restrictions, even though this medication can make you feel better. Talk with your doctor or health care professional about the   foods you eat and the vitamins that you take. What side effects may I notice from receiving this medicine? Side effects that you should report to your doctor or health care professional as soon as possible: -allergic reactions like skin rash, itching or hives, swelling of the face, lips, or tongue -breathing problems -changes in vision -chest pain -confusion, trouble speaking  or understanding -feeling faint or lightheaded, falls -high blood pressure -muscle aches or pains -pain, swelling, warmth in the leg -rapid weight gain -severe headaches -sudden numbness or weakness of the face, arm or leg -trouble walking, dizziness, loss of balance or coordination -seizures (convulsions) -swelling of the ankles, feet, hands -unusually weak or tired Side effects that usually do not require medical attention (report to your doctor or health care professional if they continue or are bothersome): -diarrhea -fever, chills (flu-like symptoms) -headaches -nausea, vomiting -redness, stinging, or swelling at site where injected This list may not describe all possible side effects. Call your doctor for medical advice about side effects. You may report side effects to FDA at 1-800-FDA-1088. Where should I keep my medicine? Keep out of the reach of children. Store in a refrigerator between 2 and 8 degrees C (36 and 46 degrees F). Do not freeze. Do not shake. Throw away any unused portion if using a single-dose vial. Throw away any unused medicine after the expiration date. NOTE: This sheet is a summary. It may not cover all possible information. If you have questions about this medicine, talk to your doctor, pharmacist, or health care provider.  2012, Elsevier/Gold Standard. (09/26/2008 10:23:57 AM) 

## 2012-09-27 ENCOUNTER — Other Ambulatory Visit: Payer: 59

## 2012-09-27 ENCOUNTER — Ambulatory Visit: Payer: 59

## 2012-09-27 ENCOUNTER — Other Ambulatory Visit: Payer: 59 | Admitting: Lab

## 2012-09-27 ENCOUNTER — Ambulatory Visit: Payer: 59 | Admitting: Hematology & Oncology

## 2012-09-30 ENCOUNTER — Telehealth: Payer: Self-pay | Admitting: *Deleted

## 2012-09-30 MED ORDER — CALCITRIOL 0.5 MCG PO CAPS: 0.5000 ug | ORAL_CAPSULE | Freq: Every day | ORAL | Status: DC

## 2012-09-30 NOTE — Telephone Encounter (Signed)
Left msg on triage [facility]. Called pt back inform him sent rx to mail service...Raechel Chute

## 2012-10-04 ENCOUNTER — Ambulatory Visit (HOSPITAL_BASED_OUTPATIENT_CLINIC_OR_DEPARTMENT_OTHER): Payer: BC Managed Care – PPO

## 2012-10-04 ENCOUNTER — Other Ambulatory Visit (HOSPITAL_BASED_OUTPATIENT_CLINIC_OR_DEPARTMENT_OTHER): Payer: BC Managed Care – PPO | Admitting: Lab

## 2012-10-04 VITALS — BP 156/92 | HR 84 | Temp 97.0°F | Resp 20

## 2012-10-04 DIAGNOSIS — D649 Anemia, unspecified: Secondary | ICD-10-CM

## 2012-10-04 DIAGNOSIS — D631 Anemia in chronic kidney disease: Secondary | ICD-10-CM

## 2012-10-04 DIAGNOSIS — N189 Chronic kidney disease, unspecified: Secondary | ICD-10-CM

## 2012-10-04 DIAGNOSIS — N039 Chronic nephritic syndrome with unspecified morphologic changes: Secondary | ICD-10-CM

## 2012-10-04 LAB — CBC WITH DIFFERENTIAL (CANCER CENTER ONLY)
BASO%: 0.4 % (ref 0.0–2.0)
Eosinophils Absolute: 0.3 10*3/uL (ref 0.0–0.5)
HCT: 25.8 % — ABNORMAL LOW (ref 38.7–49.9)
HGB: 8 g/dL — ABNORMAL LOW (ref 13.0–17.1)
LYMPH#: 1.2 10*3/uL (ref 0.9–3.3)
LYMPH%: 15.9 % (ref 14.0–48.0)
MCV: 89 fL (ref 82–98)
MONO#: 0.8 10*3/uL (ref 0.1–0.9)
NEUT%: 69.2 % (ref 40.0–80.0)
RBC: 2.91 10*6/uL — ABNORMAL LOW (ref 4.20–5.70)
WBC: 7.3 10*3/uL (ref 4.0–10.0)

## 2012-10-04 MED ORDER — DARBEPOETIN ALFA-POLYSORBATE 300 MCG/0.6ML IJ SOLN
300.0000 ug | Freq: Once | INTRAMUSCULAR | Status: AC
  Administered 2012-10-04: 300 ug via SUBCUTANEOUS

## 2012-10-04 NOTE — Patient Instructions (Signed)
Darbepoetin Alfa injection What is this medicine? DARBEPOETIN ALFA (dar be POE e tin AL fa) helps your body make more red blood cells. It is used to treat anemia caused by chronic kidney failure and chemotherapy. This medicine may be used for other purposes; ask your health care provider or pharmacist if you have questions. What should I tell my health care provider before I take this medicine? They need to know if you have any of these conditions: -blood clotting disorders or history of blood clots -cancer patient not on chemotherapy -cystic fibrosis -heart disease, such as angina, heart failure, or a history of a heart attack -hemoglobin level of 12 g/dL or greater -high blood pressure -low levels of folate, iron, or vitamin B12 -seizures -an unusual or allergic reaction to darbepoetin, erythropoietin, albumin, hamster proteins, latex, other medicines, foods, dyes, or preservatives -pregnant or trying to get pregnant -breast-feeding How should I use this medicine? This medicine is for injection into a vein or under the skin. It is usually given by a health care professional in a hospital or clinic setting. If you get this medicine at home, you will be taught how to prepare and give this medicine. Do not shake the solution before you withdraw a dose. Use exactly as directed. Take your medicine at regular intervals. Do not take your medicine more often than directed. It is important that you put your used needles and syringes in a special sharps container. Do not put them in a trash can. If you do not have a sharps container, call your pharmacist or healthcare provider to get one. Talk to your pediatrician regarding the use of this medicine in children. While this medicine may be used in children as young as 1 year for selected conditions, precautions do apply. Overdosage: If you think you have taken too much of this medicine contact a poison control center or emergency room at once. NOTE:  This medicine is only for you. Do not share this medicine with others. What if I miss a dose? If you miss a dose, take it as soon as you can. If it is almost time for your next dose, take only that dose. Do not take double or extra doses. What may interact with this medicine? Do not take this medicine with any of the following medications: -epoetin alfa This list may not describe all possible interactions. Give your health care provider a list of all the medicines, herbs, non-prescription drugs, or dietary supplements you use. Also tell them if you smoke, drink alcohol, or use illegal drugs. Some items may interact with your medicine. What should I watch for while using this medicine? Visit your prescriber or health care professional for regular checks on your progress and for the needed blood tests and blood pressure measurements. It is especially important for the doctor to make sure your hemoglobin level is in the desired range, to limit the risk of potential side effects and to give you the best benefit. Keep all appointments for any recommended tests. Check your blood pressure as directed. Ask your doctor what your blood pressure should be and when you should contact him or her. As your body makes more red blood cells, you may need to take iron, folic acid, or vitamin B supplements. Ask your doctor or health care provider which products are right for you. If you have kidney disease continue dietary restrictions, even though this medication can make you feel better. Talk with your doctor or health care professional about the   foods you eat and the vitamins that you take. What side effects may I notice from receiving this medicine? Side effects that you should report to your doctor or health care professional as soon as possible: -allergic reactions like skin rash, itching or hives, swelling of the face, lips, or tongue -breathing problems -changes in vision -chest pain -confusion, trouble speaking  or understanding -feeling faint or lightheaded, falls -high blood pressure -muscle aches or pains -pain, swelling, warmth in the leg -rapid weight gain -severe headaches -sudden numbness or weakness of the face, arm or leg -trouble walking, dizziness, loss of balance or coordination -seizures (convulsions) -swelling of the ankles, feet, hands -unusually weak or tired Side effects that usually do not require medical attention (report to your doctor or health care professional if they continue or are bothersome): -diarrhea -fever, chills (flu-like symptoms) -headaches -nausea, vomiting -redness, stinging, or swelling at site where injected This list may not describe all possible side effects. Call your doctor for medical advice about side effects. You may report side effects to FDA at 1-800-FDA-1088. Where should I keep my medicine? Keep out of the reach of children. Store in a refrigerator between 2 and 8 degrees C (36 and 46 degrees F). Do not freeze. Do not shake. Throw away any unused portion if using a single-dose vial. Throw away any unused medicine after the expiration date. NOTE: This sheet is a summary. It may not cover all possible information. If you have questions about this medicine, talk to your doctor, pharmacist, or health care provider.  2012, Elsevier/Gold Standard. (09/26/2008 10:23:57 AM) 

## 2012-10-07 ENCOUNTER — Ambulatory Visit: Payer: BC Managed Care – PPO | Admitting: Internal Medicine

## 2012-10-13 ENCOUNTER — Telehealth: Payer: Self-pay | Admitting: Hematology & Oncology

## 2012-10-13 NOTE — Telephone Encounter (Signed)
Pt made 1-7 inj appointment

## 2012-10-14 ENCOUNTER — Other Ambulatory Visit: Payer: Self-pay | Admitting: *Deleted

## 2012-10-14 MED ORDER — HYDROXYCHLOROQUINE SULFATE 200 MG PO TABS: 200.0000 mg | ORAL_TABLET | Freq: Two times a day (BID) | ORAL | Status: DC

## 2012-10-14 NOTE — Telephone Encounter (Signed)
Pt requesting refill of Plaquenil to [facility]. Pt informed rx sent via VM and to callback office with any questions/concerns.

## 2012-10-29 ENCOUNTER — Other Ambulatory Visit: Payer: Self-pay | Admitting: *Deleted

## 2012-10-29 NOTE — Telephone Encounter (Signed)
Opened encounter in error  

## 2012-11-02 ENCOUNTER — Telehealth: Payer: Self-pay | Admitting: Hematology & Oncology

## 2012-11-02 ENCOUNTER — Other Ambulatory Visit (HOSPITAL_BASED_OUTPATIENT_CLINIC_OR_DEPARTMENT_OTHER): Payer: BC Managed Care – PPO | Admitting: Lab

## 2012-11-02 ENCOUNTER — Ambulatory Visit (HOSPITAL_BASED_OUTPATIENT_CLINIC_OR_DEPARTMENT_OTHER): Payer: BC Managed Care – PPO

## 2012-11-02 ENCOUNTER — Ambulatory Visit (HOSPITAL_BASED_OUTPATIENT_CLINIC_OR_DEPARTMENT_OTHER): Payer: BC Managed Care – PPO | Admitting: Hematology & Oncology

## 2012-11-02 ENCOUNTER — Ambulatory Visit: Payer: BC Managed Care – PPO

## 2012-11-02 VITALS — BP 165/100 | HR 81 | Temp 97.0°F | Resp 20 | Ht 68.0 in | Wt 227.0 lb

## 2012-11-02 DIAGNOSIS — D649 Anemia, unspecified: Secondary | ICD-10-CM

## 2012-11-02 DIAGNOSIS — D631 Anemia in chronic kidney disease: Secondary | ICD-10-CM

## 2012-11-02 DIAGNOSIS — N289 Disorder of kidney and ureter, unspecified: Secondary | ICD-10-CM

## 2012-11-02 DIAGNOSIS — N189 Chronic kidney disease, unspecified: Secondary | ICD-10-CM

## 2012-11-02 LAB — CBC WITH DIFFERENTIAL (CANCER CENTER ONLY)
BASO#: 0 10*3/uL (ref 0.0–0.2)
BASO%: 0.6 % (ref 0.0–2.0)
HCT: 23.3 % — ABNORMAL LOW (ref 38.7–49.9)
HGB: 7.2 g/dL — ABNORMAL LOW (ref 13.0–17.1)
LYMPH#: 1.3 10*3/uL (ref 0.9–3.3)
LYMPH%: 18.4 % (ref 14.0–48.0)
MCHC: 30.9 g/dL — ABNORMAL LOW (ref 32.0–35.9)
MCV: 90 fL (ref 82–98)
MONO#: 0.7 10*3/uL (ref 0.1–0.9)
NEUT%: 66.3 % (ref 40.0–80.0)
RDW: 14.7 % (ref 11.1–15.7)

## 2012-11-02 MED ORDER — DARBEPOETIN ALFA-POLYSORBATE 300 MCG/0.6ML IJ SOLN
300.0000 ug | Freq: Once | INTRAMUSCULAR | Status: AC
  Administered 2012-11-02: 300 ug via SUBCUTANEOUS

## 2012-11-02 NOTE — Progress Notes (Signed)
CC:   Georgina Quint. Plotnikov, MD [facility] [employer] D[facility], M.D.  DIAGNOSES: 1. Anemia of renal insufficiency. 2. Systemic lupus. 3. Immune thrombocytopenia.  CURRENT THERAPY:  Aranesp 300 mcg subcu as needed for hemoglobin less than 11.  INTERIM HISTORY:  Mr. Minshall comes in for his followup.  He is doing okay. Unfortunately, his hemoglobin continues to drop.  It is hard to say why this is dropping.  He has not noticed any melena or bright red blood per rectum.  There has been no hemoptysis or hematemesis.  Back in October, his hemoglobin was 11.3.  Since then, it has been dropping every month.  He has been getting his Aranesp.  He has had no problems with the Aranesp.  He has had no fever.  He has had no weight loss or weight gain.  His appetite has been okay.  He has had no fevers, sweats or chills.  There has been no leg swelling.  PHYSICAL EXAMINATION:  This is a well-developed, well-nourished black gentleman in no obvious distress.  Vital signs 97, pulse 81, respiratory rate 20, blood pressure 165/100.  Weight is 273.  Head and neck: Normocephalic, atraumatic skull.  There are no ocular or oral lesions. There are no palpable cervical or supraclavicular lymph nodes.  Lungs: Clear bilaterally.  Cardiac:  Regular rate and rhythm with a normal S1 and S2.  There are no murmurs, rubs or bruits.  Abdomen:  Soft with good bowel sounds.  There is no palpable abdominal mass.  There is no palpable hepatosplenomegaly.  Rectal:  Some small external hemorrhoids. He has had no internal masses.  Prostate is smooth.  There is scant stool.  What was there is heme-negative.  Extremities:  No clubbing, cyanosis or edema.  Skin:  No rashes.  LABORATORY STUDIES:  White cell count 7.7, hemoglobin 7.2, hematocrit 23.3, platelet count 177.  MCV is 90.  Blood smear shows a normochromic, normocytic population of red blood cells.  There are no schistocytes.  There is no spherocytes.  I see  no polychromasia.  He has no rouleaux formation.  There are no target cells.  White cells appear normal in morphology and maturation.  There are no immature myeloid or lymphoid cells.  I do not see any hypersegmented polys.  Platelets are adequate in number and size.  IMPRESSION:  Mr. Therien is a 47 year old gentleman with renal insufficiency.  The last BUN and creatinine that I have on him were back in August and were 57 and 6.6.  I just want have a hard time understanding why his hemoglobin is not responding to the Aranesp.  He is not bleeding.  I suppose he could be iron deficient.  We are going to have to get him back in to have his iron studies checked.  Unfortunately, he had to go to work today.  He is asymptomatic with this anemia.  As such, I think we can hold off on transfusing him.  I did give him a guaiac card set to do this week that he will bring in.  We will give him the Aranesp today.  We will have him have his iron studies checked this week.  I will plan to see him back in another couple of weeks.    ______________________________ Josph Macho, M.D. PRE/MEDQ  D:  11/02/2012  T:  11/02/2012  Job:  [id number]

## 2012-11-02 NOTE — Telephone Encounter (Signed)
Pt called scheduled 1-28 appointment

## 2012-11-02 NOTE — Progress Notes (Signed)
This office note has been dictated.

## 2012-11-02 NOTE — Patient Instructions (Signed)
Guaiac Test A guaiac test checks for blood in the poop (bowel movement). BEFORE THE TEST  Avoid alcohol and iron pills.  Only take medicine as told by your doctor.  Ask your doctor about stopping or changing medicines.  Avoid Vitamin C pills or medicines.  Avoid antiseptic solution that has iodine in it.  Avoid citrus fruits and juices. TEST  Put the sample cards and sticks in the bathroom so they will be ready when you have to poop.  Collect a poop sample with the stick.  Smear a small amount of poop on the card.  Take another sample from a different part of the poop.  Store the cards with the samples in a plastic bag.  Throw the rest of the poop into the toilet and flush.  Wash your hands well. AFTER THE TEST  Bring the cards in a plastic bag to your clinic.  Do not wait more than 3 days to take the card to your clinic. Document Released: 07/22/2008 Document Revised: 01/05/2012 Document Reviewed: 07/22/2008 [facility] Patient Information 2013 [place], [place].     Darbepoetin Alfa injection What is this medicine? DARBEPOETIN ALFA (dar be POE e tin AL fa) helps your body make more red blood cells. It is used to treat anemia caused by chronic kidney failure and chemotherapy. This medicine may be used for other purposes; ask your health care provider or pharmacist if you have questions. What should I tell my health care provider before I take this medicine? They need to know if you have any of these conditions: -blood clotting disorders or history of blood clots -cancer patient not on chemotherapy -cystic fibrosis -heart disease, such as angina, heart failure, or a history of a heart attack -hemoglobin level of 12 g/dL or greater -high blood pressure -low levels of folate, iron, or vitamin B12 -seizures -an unusual or allergic reaction to darbepoetin, erythropoietin, albumin, hamster proteins, latex, other medicines, foods, dyes, or preservatives -pregnant or  trying to get pregnant -breast-feeding How should I use this medicine? This medicine is for injection into a vein or under the skin. It is usually given by a health care professional in a hospital or clinic setting. If you get this medicine at home, you will be taught how to prepare and give this medicine. Do not shake the solution before you withdraw a dose. Use exactly as directed. Take your medicine at regular intervals. Do not take your medicine more often than directed. It is important that you put your used needles and syringes in a special sharps container. Do not put them in a trash can. If you do not have a sharps container, call your pharmacist or healthcare provider to get one. Talk to your pediatrician regarding the use of this medicine in children. While this medicine may be used in children as young as 1 year for selected conditions, precautions do apply. Overdosage: If you think you have taken too much of this medicine contact a poison control center or emergency room at once. NOTE: This medicine is only for you. Do not share this medicine with others. What if I miss a dose? If you miss a dose, take it as soon as you can. If it is almost time for your next dose, take only that dose. Do not take double or extra doses. What may interact with this medicine? Do not take this medicine with any of the following medications: -epoetin alfa This list may not describe all possible interactions. Give your health care provider  a list of all the medicines, herbs, non-prescription drugs, or dietary supplements you use. Also tell them if you smoke, drink alcohol, or use illegal drugs. Some items may interact with your medicine. What should I watch for while using this medicine? Visit your prescriber or health care professional for regular checks on your progress and for the needed blood tests and blood pressure measurements. It is especially important for the doctor to make sure your hemoglobin level  is in the desired range, to limit the risk of potential side effects and to give you the best benefit. Keep all appointments for any recommended tests. Check your blood pressure as directed. Ask your doctor what your blood pressure should be and when you should contact him or her. As your body makes more red blood cells, you may need to take iron, folic acid, or vitamin B supplements. Ask your doctor or health care provider which products are right for you. If you have kidney disease continue dietary restrictions, even though this medication can make you feel better. Talk with your doctor or health care professional about the foods you eat and the vitamins that you take. What side effects may I notice from receiving this medicine? Side effects that you should report to your doctor or health care professional as soon as possible: -allergic reactions like skin rash, itching or hives, swelling of the face, lips, or tongue -breathing problems -changes in vision -chest pain -confusion, trouble speaking or understanding -feeling faint or lightheaded, falls -high blood pressure -muscle aches or pains -pain, swelling, warmth in the leg -rapid weight gain -severe headaches -sudden numbness or weakness of the face, arm or leg -trouble walking, dizziness, loss of balance or coordination -seizures (convulsions) -swelling of the ankles, feet, hands -unusually weak or tired Side effects that usually do not require medical attention (report to your doctor or health care professional if they continue or are bothersome): -diarrhea -fever, chills (flu-like symptoms) -headaches -nausea, vomiting -redness, stinging, or swelling at site where injected This list may not describe all possible side effects. Call your doctor for medical advice about side effects. You may report side effects to FDA at 1-800-FDA-1088. Where should I keep my medicine? Keep out of the reach of children. Store in a refrigerator  between 2 and 8 degrees C (36 and 46 degrees F). Do not freeze. Do not shake. Throw away any unused portion if using a single-dose vial. Throw away any unused medicine after the expiration date. NOTE: This sheet is a summary. It may not cover all possible information. If you have questions about this medicine, talk to your doctor, pharmacist, or health care provider.  2012, [employer]. (09/26/2008 10:23:57 AM)

## 2012-11-03 ENCOUNTER — Telehealth: Payer: Self-pay | Admitting: Hematology & Oncology

## 2012-11-03 NOTE — Telephone Encounter (Signed)
Left pt message that Dr. Myna Hidalgo wants him to get a lab next week and that 1-28 appointment time changed and to please call.

## 2012-11-03 NOTE — Telephone Encounter (Signed)
Pt made 1-13 lab appointment and is aware of 1-28 time change

## 2012-11-08 ENCOUNTER — Other Ambulatory Visit: Payer: Self-pay | Admitting: *Deleted

## 2012-11-08 ENCOUNTER — Other Ambulatory Visit: Payer: Self-pay | Admitting: Hematology & Oncology

## 2012-11-08 ENCOUNTER — Ambulatory Visit (HOSPITAL_BASED_OUTPATIENT_CLINIC_OR_DEPARTMENT_OTHER): Payer: BC Managed Care – PPO | Admitting: Lab

## 2012-11-08 DIAGNOSIS — D631 Anemia in chronic kidney disease: Secondary | ICD-10-CM

## 2012-11-08 DIAGNOSIS — D649 Anemia, unspecified: Secondary | ICD-10-CM

## 2012-11-08 DIAGNOSIS — D509 Iron deficiency anemia, unspecified: Secondary | ICD-10-CM

## 2012-11-08 LAB — FERRITIN: Ferritin: 455 ng/mL — ABNORMAL HIGH (ref 22–322)

## 2012-11-08 LAB — CBC WITH DIFFERENTIAL (CANCER CENTER ONLY)
BASO%: 0.4 % (ref 0.0–2.0)
HCT: 23.1 % — ABNORMAL LOW (ref 38.7–49.9)
LYMPH%: 14.5 % (ref 14.0–48.0)
MCH: 27.7 pg — ABNORMAL LOW (ref 28.0–33.4)
MCHC: 30.7 g/dL — ABNORMAL LOW (ref 32.0–35.9)
MCV: 90 fL (ref 82–98)
MONO#: 0.9 10*3/uL (ref 0.1–0.9)
MONO%: 11.7 % (ref 0.0–13.0)
NEUT%: 71 % (ref 40.0–80.0)
Platelets: 209 10*3/uL (ref 145–400)
RDW: 14.9 % (ref 11.1–15.7)
WBC: 7.8 10*3/uL (ref 4.0–10.0)

## 2012-11-08 LAB — IRON AND TIBC: %SAT: 33 % (ref 20–55)

## 2012-11-08 LAB — FECAL OCCULT BLOOD, GUIAC - CHCC SATELLITE: Occult Blood: NEGATIVE

## 2012-11-08 LAB — RETICULOCYTES (CHCC)
ABS Retic: 72.4 10*3/uL (ref 19.0–186.0)
RBC.: 2.68 MIL/uL — ABNORMAL LOW (ref 4.22–5.81)
Retic Ct Pct: 2.7 % — ABNORMAL HIGH (ref 0.4–2.3)

## 2012-11-09 ENCOUNTER — Ambulatory Visit (HOSPITAL_BASED_OUTPATIENT_CLINIC_OR_DEPARTMENT_OTHER): Payer: BC Managed Care – PPO

## 2012-11-09 VITALS — BP 145/80 | HR 77 | Temp 97.0°F | Resp 18

## 2012-11-09 DIAGNOSIS — D649 Anemia, unspecified: Secondary | ICD-10-CM

## 2012-11-09 MED ORDER — SODIUM CHLORIDE 0.9 % IV SOLN
INTRAVENOUS | Status: DC
  Administered 2012-11-09: 09:00:00 via INTRAVENOUS

## 2012-11-09 MED ORDER — SODIUM CHLORIDE 0.9 % IV SOLN
1020.0000 mg | Freq: Once | INTRAVENOUS | Status: AC
  Administered 2012-11-09: 1020 mg via INTRAVENOUS
  Filled 2012-11-09: qty 34

## 2012-11-09 NOTE — Patient Instructions (Signed)
Ferumoxytol injection What is this medicine? FERUMOXYTOL is an iron complex. Iron is used to make healthy red blood cells, which carry oxygen and nutrients throughout the body. This medicine is used to treat iron deficiency anemia in people with chronic kidney disease. This medicine may be used for other purposes; ask your health care provider or pharmacist if you have questions. What should I tell my health care provider before I take this medicine? They need to know if you have any of these conditions: -anemia not caused by low iron levels -high levels of iron in the blood -magnetic resonance imaging (MRI) test scheduled -an unusual or allergic reaction to iron, other medicines, foods, dyes, or preservatives -pregnant or trying to get pregnant -breast-feeding How should I use this medicine? This medicine is for infusion into a vein. It is given by a health care professional in a hospital or clinic setting. Talk to your pediatrician regarding the use of this medicine in children. Special care may be needed. Overdosage: If you think you've taken too much of this medicine contact a poison control center or emergency room at once. Overdosage: If you think you have taken too much of this medicine contact a poison control center or emergency room at once. NOTE: This medicine is only for you. Do not share this medicine with others. What if I miss a dose? It is important not to miss your dose. Call your doctor or health care professional if you are unable to keep an appointment. What may interact with this medicine? This medicine may interact with the following medications: -other iron products This list may not describe all possible interactions. Give your health care provider a list of all the medicines, herbs, non-prescription drugs, or dietary supplements you use. Also tell them if you smoke, drink alcohol, or use illegal drugs. Some items may interact with your medicine. What should I watch  for while using this medicine? Visit your doctor or healthcare professional regularly. Tell your doctor or healthcare professional if your symptoms do not start to get better or if they get worse. You may need blood work done while you are taking this medicine. You may need to follow a special diet. Talk to your doctor. Foods that contain iron include: whole grains/cereals, dried fruits, beans, or peas, leafy green vegetables, and organ meats (liver, kidney). What side effects may I notice from receiving this medicine? Side effects that you should report to your doctor or health care professional as soon as possible: -allergic reactions like skin rash, itching or hives, swelling of the face, lips, or tongue -breathing problems -changes in blood pressure -feeling faint or lightheaded, falls -fever or chills -flushing, sweating, or hot feelings -swelling of the ankles or feet Side effects that usually do not require medical attention (Report these to your doctor or health care professional if they continue or are bothersome.): -diarrhea -headache -nausea, vomiting -stomach pain This list may not describe all possible side effects. Call your doctor for medical advice about side effects. You may report side effects to FDA at 1-800-FDA-1088. Where should I keep my medicine? This drug is given in a hospital or clinic and will not be stored at home. NOTE: This sheet is a summary. It may not cover all possible information. If you have questions about this medicine, talk to your doctor, pharmacist, or health care provider.  2012, Elsevier/Gold Standard. (07/05/2008 9:48:25 PM) 

## 2012-11-23 ENCOUNTER — Other Ambulatory Visit: Payer: BC Managed Care – PPO | Admitting: Lab

## 2012-11-23 ENCOUNTER — Ambulatory Visit: Payer: BC Managed Care – PPO

## 2012-11-23 ENCOUNTER — Ambulatory Visit (HOSPITAL_BASED_OUTPATIENT_CLINIC_OR_DEPARTMENT_OTHER): Payer: BC Managed Care – PPO

## 2012-11-23 ENCOUNTER — Other Ambulatory Visit (HOSPITAL_BASED_OUTPATIENT_CLINIC_OR_DEPARTMENT_OTHER): Payer: BC Managed Care – PPO | Admitting: Lab

## 2012-11-23 ENCOUNTER — Ambulatory Visit (HOSPITAL_BASED_OUTPATIENT_CLINIC_OR_DEPARTMENT_OTHER): Payer: BC Managed Care – PPO | Admitting: Medical

## 2012-11-23 VITALS — BP 171/99 | HR 99 | Temp 98.4°F | Resp 70 | Ht 68.0 in | Wt 235.0 lb

## 2012-11-23 DIAGNOSIS — D649 Anemia, unspecified: Secondary | ICD-10-CM

## 2012-11-23 DIAGNOSIS — N189 Chronic kidney disease, unspecified: Secondary | ICD-10-CM

## 2012-11-23 DIAGNOSIS — D631 Anemia in chronic kidney disease: Secondary | ICD-10-CM

## 2012-11-23 DIAGNOSIS — N289 Disorder of kidney and ureter, unspecified: Secondary | ICD-10-CM

## 2012-11-23 DIAGNOSIS — D509 Iron deficiency anemia, unspecified: Secondary | ICD-10-CM

## 2012-11-23 LAB — CMP (CANCER CENTER ONLY)
ALT(SGPT): 12 U/L (ref 10–47)
AST: 11 U/L (ref 11–38)
Alkaline Phosphatase: 51 U/L (ref 26–84)
Calcium: 9.2 mg/dL (ref 8.0–10.3)
Chloride: 109 mEq/L — ABNORMAL HIGH (ref 98–108)
Creat: 8.7 mg/dl (ref 0.6–1.2)
Potassium: 5.7 mEq/L — ABNORMAL HIGH (ref 3.3–4.7)

## 2012-11-23 LAB — CBC WITH DIFFERENTIAL (CANCER CENTER ONLY)
BASO%: 0.6 % (ref 0.0–2.0)
EOS%: 4.1 % (ref 0.0–7.0)
LYMPH#: 1 10*3/uL (ref 0.9–3.3)
MCHC: 30.9 g/dL — ABNORMAL LOW (ref 32.0–35.9)
MONO%: 9.4 % (ref 0.0–13.0)
NEUT#: 4.9 10*3/uL (ref 1.5–6.5)
NEUT%: 71.2 % (ref 40.0–80.0)
RDW: 15.7 % (ref 11.1–15.7)

## 2012-11-23 LAB — HOLD TUBE, BLOOD BANK - CHCC SATELLITE

## 2012-11-23 MED ORDER — DARBEPOETIN ALFA-POLYSORBATE 300 MCG/0.6ML IJ SOLN: 300.0000 ug | Freq: Once | INTRAMUSCULAR | Status: DC

## 2012-11-23 NOTE — Progress Notes (Signed)
Diagnoses: #1.  Anemia of renal insufficiency. #2.  Systemic lupus. #3.  Immune thrombocytopenia.  Current therapy: Aranesp 300 mcg subcutaneous as needed for hemoglobin less than 11.  Interim history: Mr. Alphin presents today for an office followup visit.  Unfortunately, during his last visit.  It was noted that his hemoglobin continued to drop despite Aranesp injections.  It was difficult to say why his hemoglobin was dropping, so much.  He was sent home with Hemoccult cards, which came back negative x3.  We decided to go ahead and give him IV iron was January 14.  His hemoglobin came up somewhat since iron.  It was 7.1, back on January 13 and today.  It is 8.2.  Mr. Stephens also reports, that he is being followed by a nephrologist report or stone, secondary to renal insufficiency.  We did get a chemistry.  Mr. Mau today and his creatinine came back quite elevated at 8.7.  His potassium was 5.7.  I went ahead and faxed over.  These results to his nephrologist.  At this time, this seems to be one of his bigger issues.  I would think in the near future.  He is going to have to be on dialysis.  He, reports, that he has a good appetite.  He denies any nausea, vomiting, diarrhea, constipation, any chest pain, shortness breath, or cough.  He denies any fevers, chills, or night sweats.  He denies any obvious, or abnormal swelling.  He denies any lower leg swelling.  He denies any headaches, visual changes, or rashes.  Review of Systems: Constitutional:Negative for malaise/fatigue, fever, chills, weight loss, diaphoresis, activity change, appetite change, and unexpected weight change.  HEENT: Negative for double vision, blurred vision, visual loss, ear pain, tinnitus, congestion, rhinorrhea, epistaxis sore throat or sinus disease, oral pain/lesion, tongue soreness Respiratory: Negative for cough, chest tightness, shortness of breath, wheezing and stridor.  Cardiovascular: Negative for chest pain, palpitations,  leg swelling, orthopnea, PND, DOE or claudication Gastrointestinal: Negative for nausea, vomiting, abdominal pain, diarrhea, constipation, blood in stool, melena, hematochezia, abdominal distention, anal bleeding, rectal pain, anorexia and hematemesis.  Genitourinary: Negative for dysuria, frequency, hematuria,  Musculoskeletal: Negative for myalgias, back pain, joint swelling, arthralgias and gait problem.  Skin: Negative for rash, color change, pallor and wound.  Neurological:. Negative for dizziness/light-headedness, tremors, seizures, syncope, facial asymmetry, speech difficulty, weakness, numbness, headaches and paresthesias.  Hematological: Negative for adenopathy. Does not bruise/bleed easily.  Psychiatric/Behavioral:  Negative for depression, no loss of interest in normal activity or change in sleep pattern.   Physical Exam: This is a 47 year old, well-developed, well-nourished, African American, gentleman, in no obvious distress Vitals: Temperature 98.4 degrees, pulse 99, respirations 17, blood pressure 171/99, weight 235 pounds HEENT reveals a normocephalic, atraumatic skull, no scleral icterus, no oral lesions  Neck is supple without any cervical or supraclavicular adenopathy.  Lungs are clear to auscultation bilaterally. There are no wheezes, rales or rhonci Cardiac is regular rate and rhythm with a normal S1 and S2. There are no murmurs, rubs, or bruits.  Abdomen is soft with good bowel sounds, there is no palpable mass. There is no palpable hepatosplenomegaly. There is no palpable fluid wave.  Musculoskeletal no tenderness of the spine, ribs, or hips.  Extremities there are no clubbing, cyanosis, or edema.  Skin no petechia, purpura or ecchymosis Neurologic is nonfocal.  Laboratory Data: White count 6.8, hemoglobin 8.2, hematocrit 26.5.  Platelets 180,000  Current Outpatient Prescriptions on File Prior to Visit  Medication Sig  Dispense Refill  . allopurinol (ZYLOPRIM) 100 MG  tablet Take 1 tablet (100 mg total) by mouth daily.  90 tablet  3  . amLODipine (NORVASC) 5 MG tablet Take 1 tablet (5 mg total) by mouth daily.  90 tablet  3  . atorvastatin (LIPITOR) 40 MG tablet Take 1 tablet (40 mg total) by mouth daily.  90 tablet  3  . calcitRIOL (ROCALTROL) 0.5 MCG capsule Take 1 capsule (0.5 mcg total) by mouth daily.  90 capsule  2  . darbepoetin (ARANESP, ALB FREE, SURECLICK) 100 MCG/0.5ML SOLN Inject 100 mcg into the skin every 30 (thirty) days.        . furosemide (LASIX) 40 MG tablet Take 2 tablets (80 mg total) by mouth daily.  180 tablet  3  . hydroxychloroquine (PLAQUENIL) 200 MG tablet Take 1 tablet (200 mg total) by mouth 2 (two) times daily.  180 tablet  2  . labetalol (NORMODYNE) 300 MG tablet Take 1 tablet (300 mg total) by mouth 3 (three) times daily.  270 tablet  3  . lisinopril (PRINIVIL,ZESTRIL) 20 MG tablet 1 po q am and 2 po qhs  270 tablet  3  . mycophenolate (CELLCEPT) 500 MG tablet Take 1 tablet (500 mg total) by mouth 2 (two) times daily.  180 tablet  3   Assessment/Plan: This is a pleasant, 47 year old, African American gentleman, with the following issues:  #1.  Anemia of renal insufficiency.  We will go ahead and give him an Aranesp injection today.  Again, we did give him iron.  Back on January 14, which seemed to bump up his hemoglobin somewhat.  He will continue to have labs every 3 weeks and an Aranesp injection for hemoglobin below 11.  #2.  Chronic kidney disease.  His creatinine is quite elevated today.  Again, I did fax his chemistry over to his nephrologist office of [facility].  I suspect some time here in the near future.  He will require dialysis.  #3.  Followup.  We will follow back up with Mr. Erway in about 3 weeks, but before then should there be questions or concerns.

## 2012-11-23 NOTE — Patient Instructions (Signed)
Darbepoetin Alfa injection What is this medicine? DARBEPOETIN ALFA (dar be POE e tin AL fa) helps your body make more red blood cells. It is used to treat anemia caused by chronic kidney failure and chemotherapy. This medicine may be used for other purposes; ask your health care provider or pharmacist if you have questions. What should I tell my health care provider before I take this medicine? They need to know if you have any of these conditions: -blood clotting disorders or history of blood clots -cancer patient not on chemotherapy -cystic fibrosis -heart disease, such as angina, heart failure, or a history of a heart attack -hemoglobin level of 12 g/dL or greater -high blood pressure -low levels of folate, iron, or vitamin B12 -seizures -an unusual or allergic reaction to darbepoetin, erythropoietin, albumin, hamster proteins, latex, other medicines, foods, dyes, or preservatives -pregnant or trying to get pregnant -breast-feeding How should I use this medicine? This medicine is for injection into a vein or under the skin. It is usually given by a health care professional in a hospital or clinic setting. If you get this medicine at home, you will be taught how to prepare and give this medicine. Do not shake the solution before you withdraw a dose. Use exactly as directed. Take your medicine at regular intervals. Do not take your medicine more often than directed. It is important that you put your used needles and syringes in a special sharps container. Do not put them in a trash can. If you do not have a sharps container, call your pharmacist or healthcare provider to get one. Talk to your pediatrician regarding the use of this medicine in children. While this medicine may be used in children as young as 1 year for selected conditions, precautions do apply. Overdosage: If you think you have taken too much of this medicine contact a poison control center or emergency room at once. NOTE:  This medicine is only for you. Do not share this medicine with others. What if I miss a dose? If you miss a dose, take it as soon as you can. If it is almost time for your next dose, take only that dose. Do not take double or extra doses. What may interact with this medicine? Do not take this medicine with any of the following medications: -epoetin alfa This list may not describe all possible interactions. Give your health care provider a list of all the medicines, herbs, non-prescription drugs, or dietary supplements you use. Also tell them if you smoke, drink alcohol, or use illegal drugs. Some items may interact with your medicine. What should I watch for while using this medicine? Visit your prescriber or health care professional for regular checks on your progress and for the needed blood tests and blood pressure measurements. It is especially important for the doctor to make sure your hemoglobin level is in the desired range, to limit the risk of potential side effects and to give you the best benefit. Keep all appointments for any recommended tests. Check your blood pressure as directed. Ask your doctor what your blood pressure should be and when you should contact him or her. As your body makes more red blood cells, you may need to take iron, folic acid, or vitamin B supplements. Ask your doctor or health care provider which products are right for you. If you have kidney disease continue dietary restrictions, even though this medication can make you feel better. Talk with your doctor or health care professional about the   foods you eat and the vitamins that you take. What side effects may I notice from receiving this medicine? Side effects that you should report to your doctor or health care professional as soon as possible: -allergic reactions like skin rash, itching or hives, swelling of the face, lips, or tongue -breathing problems -changes in vision -chest pain -confusion, trouble speaking  or understanding -feeling faint or lightheaded, falls -high blood pressure -muscle aches or pains -pain, swelling, warmth in the leg -rapid weight gain -severe headaches -sudden numbness or weakness of the face, arm or leg -trouble walking, dizziness, loss of balance or coordination -seizures (convulsions) -swelling of the ankles, feet, hands -unusually weak or tired Side effects that usually do not require medical attention (report to your doctor or health care professional if they continue or are bothersome): -diarrhea -fever, chills (flu-like symptoms) -headaches -nausea, vomiting -redness, stinging, or swelling at site where injected This list may not describe all possible side effects. Call your doctor for medical advice about side effects. You may report side effects to FDA at 1-800-FDA-1088. Where should I keep my medicine? Keep out of the reach of children. Store in a refrigerator between 2 and 8 degrees C (36 and 46 degrees F). Do not freeze. Do not shake. Throw away any unused portion if using a single-dose vial. Throw away any unused medicine after the expiration date. NOTE: This sheet is a summary. It may not cover all possible information. If you have questions about this medicine, talk to your doctor, pharmacist, or health care provider.  2012, Elsevier/Gold Standard. (09/26/2008 10:23:57 AM) 

## 2012-11-24 ENCOUNTER — Telehealth: Payer: Self-pay | Admitting: Hematology & Oncology

## 2012-11-24 NOTE — Telephone Encounter (Signed)
Pt made 2-18 appointment, per MD ok to skip provider appointment

## 2012-12-03 ENCOUNTER — Telehealth: Payer: Self-pay | Admitting: Internal Medicine

## 2012-12-03 MED ORDER — LABETALOL HCL 300 MG PO TABS: 300.0000 mg | ORAL_TABLET | Freq: Three times a day (TID) | ORAL | Status: DC

## 2012-12-03 NOTE — Telephone Encounter (Signed)
Rf sent. Left detailed mess informing pt of below.  

## 2012-12-03 NOTE — Telephone Encounter (Signed)
Pt is requesting a refill on Labetalol.  He has changed mail order pharmacy to [facility].  Their Refill line is [phone].  He didn't know if this was a fax number.  He wants a call to let know this has been done.

## 2012-12-13 ENCOUNTER — Ambulatory Visit (HOSPITAL_BASED_OUTPATIENT_CLINIC_OR_DEPARTMENT_OTHER): Payer: BC Managed Care – PPO

## 2012-12-13 ENCOUNTER — Other Ambulatory Visit (HOSPITAL_BASED_OUTPATIENT_CLINIC_OR_DEPARTMENT_OTHER): Payer: BC Managed Care – PPO | Admitting: Lab

## 2012-12-13 VITALS — BP 171/103 | HR 78 | Temp 97.6°F | Resp 20

## 2012-12-13 DIAGNOSIS — N189 Chronic kidney disease, unspecified: Secondary | ICD-10-CM

## 2012-12-13 DIAGNOSIS — D649 Anemia, unspecified: Secondary | ICD-10-CM

## 2012-12-13 DIAGNOSIS — D509 Iron deficiency anemia, unspecified: Secondary | ICD-10-CM

## 2012-12-13 DIAGNOSIS — D631 Anemia in chronic kidney disease: Secondary | ICD-10-CM

## 2012-12-13 DIAGNOSIS — N039 Chronic nephritic syndrome with unspecified morphologic changes: Secondary | ICD-10-CM

## 2012-12-13 LAB — IRON AND TIBC
Iron: 105 ug/dL (ref 42–165)
UIBC: 119 ug/dL — ABNORMAL LOW (ref 125–400)

## 2012-12-13 LAB — CBC WITH DIFFERENTIAL (CANCER CENTER ONLY)
BASO#: 0.1 10*3/uL (ref 0.0–0.2)
EOS%: 2.7 % (ref 0.0–7.0)
HCT: 28.5 % — ABNORMAL LOW (ref 38.7–49.9)
HGB: 9 g/dL — ABNORMAL LOW (ref 13.0–17.1)
LYMPH#: 1 10*3/uL (ref 0.9–3.3)
MCHC: 31.6 g/dL — ABNORMAL LOW (ref 32.0–35.9)
NEUT%: 74.3 % (ref 40.0–80.0)

## 2012-12-13 LAB — FERRITIN: Ferritin: 740 ng/mL — ABNORMAL HIGH (ref 22–322)

## 2012-12-13 MED ORDER — DARBEPOETIN ALFA-POLYSORBATE 300 MCG/0.6ML IJ SOLN
300.0000 ug | Freq: Once | INTRAMUSCULAR | Status: AC
  Administered 2012-12-13: 300 ug via SUBCUTANEOUS

## 2012-12-13 NOTE — Patient Instructions (Signed)
Darbepoetin Alfa injection What is this medicine? DARBEPOETIN ALFA (dar be POE e tin AL fa) helps your body make more red blood cells. It is used to treat anemia caused by chronic kidney failure and chemotherapy. This medicine may be used for other purposes; ask your health care provider or pharmacist if you have questions. What should I tell my health care provider before I take this medicine? They need to know if you have any of these conditions: -blood clotting disorders or history of blood clots -cancer patient not on chemotherapy -cystic fibrosis -heart disease, such as angina, heart failure, or a history of a heart attack -hemoglobin level of 12 g/dL or greater -high blood pressure -low levels of folate, iron, or vitamin B12 -seizures -an unusual or allergic reaction to darbepoetin, erythropoietin, albumin, hamster proteins, latex, other medicines, foods, dyes, or preservatives -pregnant or trying to get pregnant -breast-feeding How should I use this medicine? This medicine is for injection into a vein or under the skin. It is usually given by a health care professional in a hospital or clinic setting. If you get this medicine at home, you will be taught how to prepare and give this medicine. Do not shake the solution before you withdraw a dose. Use exactly as directed. Take your medicine at regular intervals. Do not take your medicine more often than directed. It is important that you put your used needles and syringes in a special sharps container. Do not put them in a trash can. If you do not have a sharps container, call your pharmacist or healthcare provider to get one. Talk to your pediatrician regarding the use of this medicine in children. While this medicine may be used in children as young as 1 year for selected conditions, precautions do apply. Overdosage: If you think you have taken too much of this medicine contact a poison control center or emergency room at once. NOTE:  This medicine is only for you. Do not share this medicine with others. What if I miss a dose? If you miss a dose, take it as soon as you can. If it is almost time for your next dose, take only that dose. Do not take double or extra doses. What may interact with this medicine? Do not take this medicine with any of the following medications: -epoetin alfa This list may not describe all possible interactions. Give your health care provider a list of all the medicines, herbs, non-prescription drugs, or dietary supplements you use. Also tell them if you smoke, drink alcohol, or use illegal drugs. Some items may interact with your medicine. What should I watch for while using this medicine? Visit your prescriber or health care professional for regular checks on your progress and for the needed blood tests and blood pressure measurements. It is especially important for the doctor to make sure your hemoglobin level is in the desired range, to limit the risk of potential side effects and to give you the best benefit. Keep all appointments for any recommended tests. Check your blood pressure as directed. Ask your doctor what your blood pressure should be and when you should contact him or her. As your body makes more red blood cells, you may need to take iron, folic acid, or vitamin B supplements. Ask your doctor or health care provider which products are right for you. If you have kidney disease continue dietary restrictions, even though this medication can make you feel better. Talk with your doctor or health care professional about the   foods you eat and the vitamins that you take. What side effects may I notice from receiving this medicine? Side effects that you should report to your doctor or health care professional as soon as possible: -allergic reactions like skin rash, itching or hives, swelling of the face, lips, or tongue -breathing problems -changes in vision -chest pain -confusion, trouble speaking  or understanding -feeling faint or lightheaded, falls -high blood pressure -muscle aches or pains -pain, swelling, warmth in the leg -rapid weight gain -severe headaches -sudden numbness or weakness of the face, arm or leg -trouble walking, dizziness, loss of balance or coordination -seizures (convulsions) -swelling of the ankles, feet, hands -unusually weak or tired Side effects that usually do not require medical attention (report to your doctor or health care professional if they continue or are bothersome): -diarrhea -fever, chills (flu-like symptoms) -headaches -nausea, vomiting -redness, stinging, or swelling at site where injected This list may not describe all possible side effects. Call your doctor for medical advice about side effects. You may report side effects to FDA at 1-800-FDA-1088. Where should I keep my medicine? Keep out of the reach of children. Store in a refrigerator between 2 and 8 degrees C (36 and 46 degrees F). Do not freeze. Do not shake. Throw away any unused portion if using a single-dose vial. Throw away any unused medicine after the expiration date. NOTE: This sheet is a summary. It may not cover all possible information. If you have questions about this medicine, talk to your doctor, pharmacist, or health care provider.  2013, [employer]. (09/26/2008 10:23:57 AM)  

## 2012-12-14 ENCOUNTER — Ambulatory Visit: Payer: BC Managed Care – PPO

## 2012-12-14 ENCOUNTER — Other Ambulatory Visit: Payer: BC Managed Care – PPO | Admitting: Lab

## 2012-12-23 ENCOUNTER — Telehealth: Payer: Self-pay | Admitting: Hematology & Oncology

## 2012-12-23 NOTE — Telephone Encounter (Signed)
Pt called made 3-11 lab and inj appointment.

## 2012-12-25 DIAGNOSIS — M3214 Glomerular disease in systemic lupus erythematosus: Secondary | ICD-10-CM

## 2012-12-25 DIAGNOSIS — N186 End stage renal disease: Secondary | ICD-10-CM

## 2012-12-25 HISTORY — DX: End stage renal disease: M32.14

## 2012-12-25 HISTORY — DX: End stage renal disease: N18.6

## 2013-01-01 IMAGING — CT CT ABD-PELV W/O CM
2 of 4 series · 14 of 32 positions shown, 19 images · non-contrast
Comparison: 07/14/2008

CLINICAL DATA: Hematuria

CT ABDOMEN AND PELVIS WITHOUT CONTRAST
TECHNIQUE: Multidetector CT imaging of the abdomen and pelvis was
performed following the standard protocol without intravenous
contrast.

[Series 2: renal stone · axial · 0.85mm/px · z∈[-438,-93]mm · 6 of 97 slices shown, 11 images]
[im 14/97  soft-tissue]
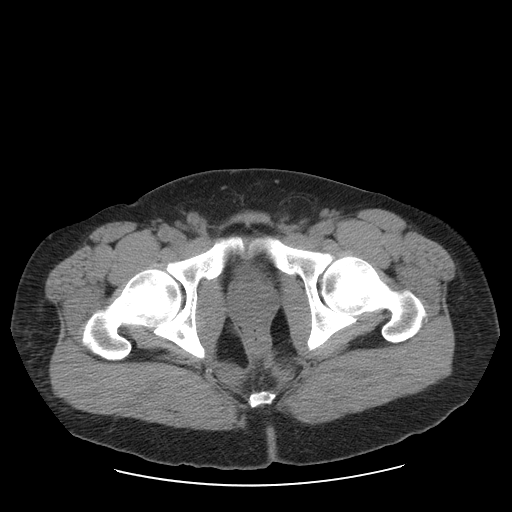
[im 14/97  bone]
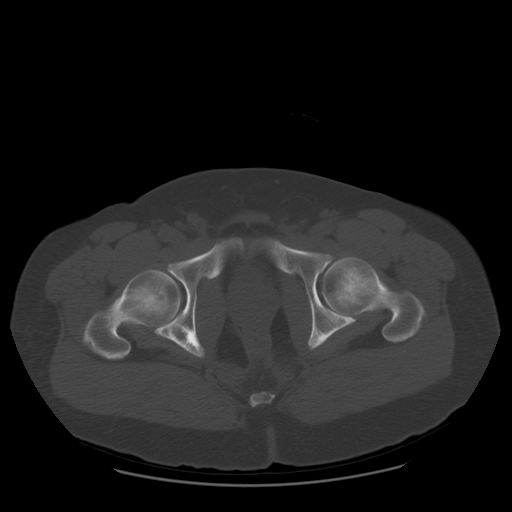
[im 28/97  soft-tissue]
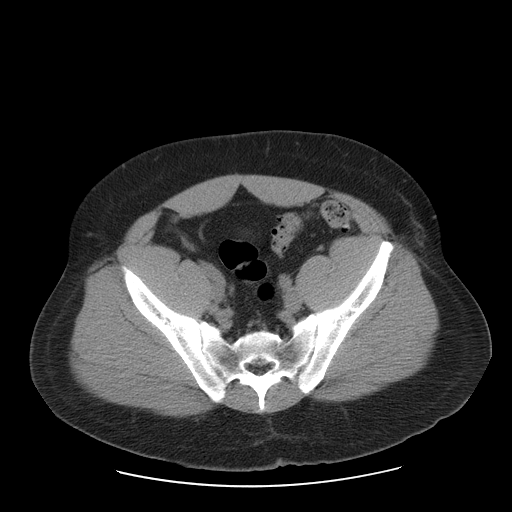
[im 42/97  soft-tissue]
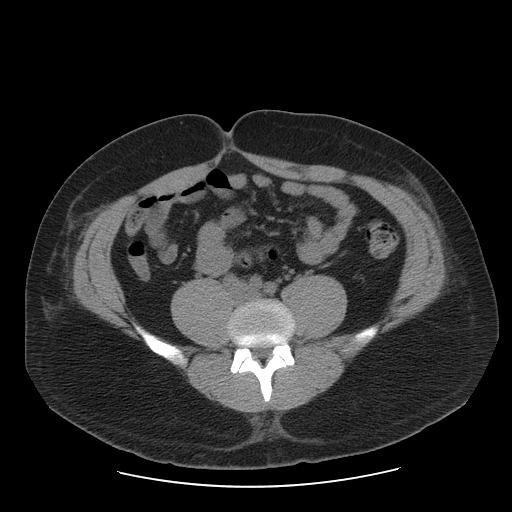
[im 42/97  lung]
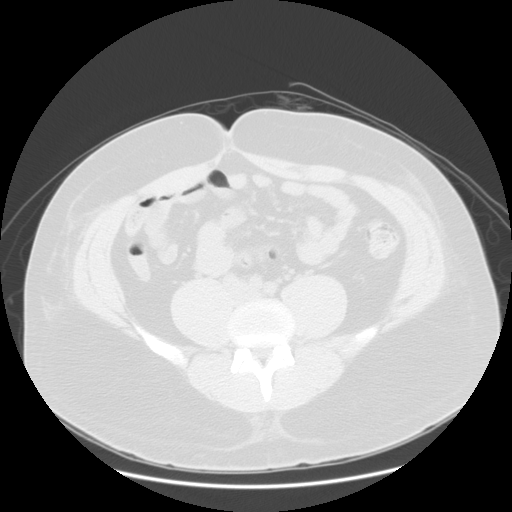
[im 55/97  soft-tissue]
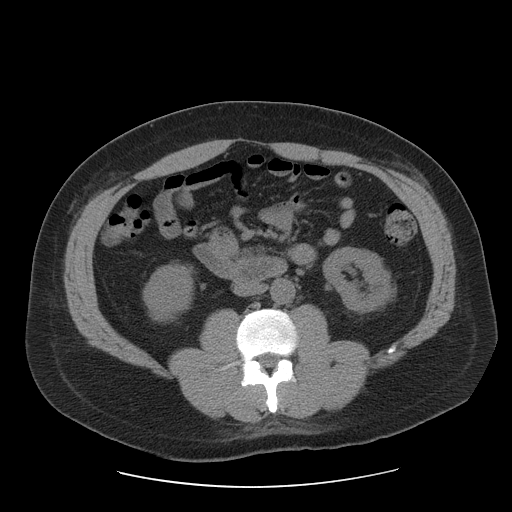
[im 55/97  lung]
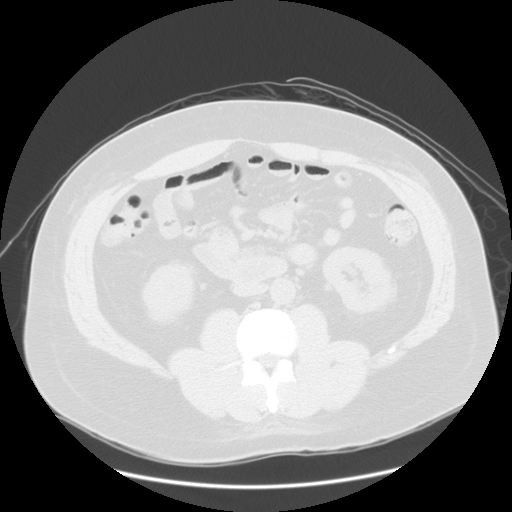
[im 69/97  soft-tissue]
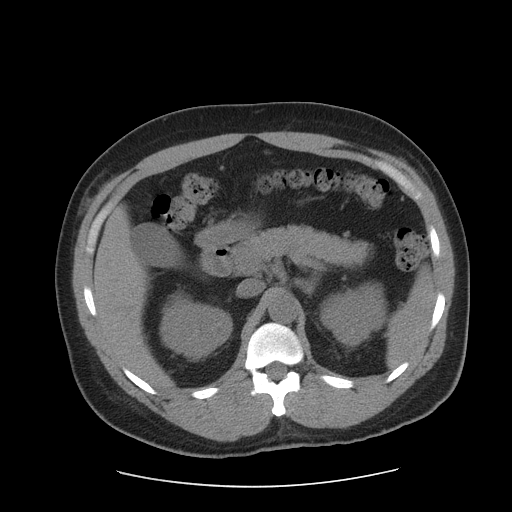
[im 69/97  lung]
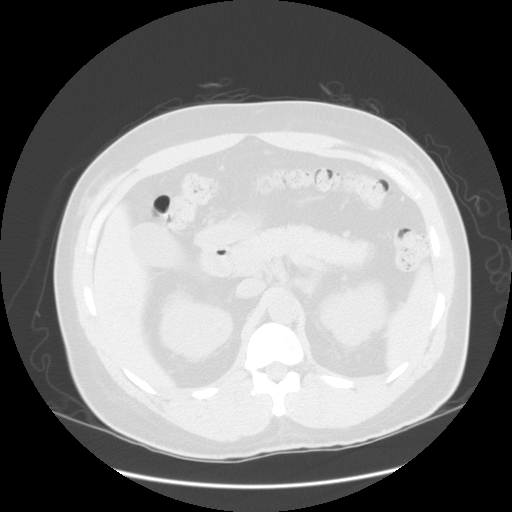
[im 83/97  soft-tissue]
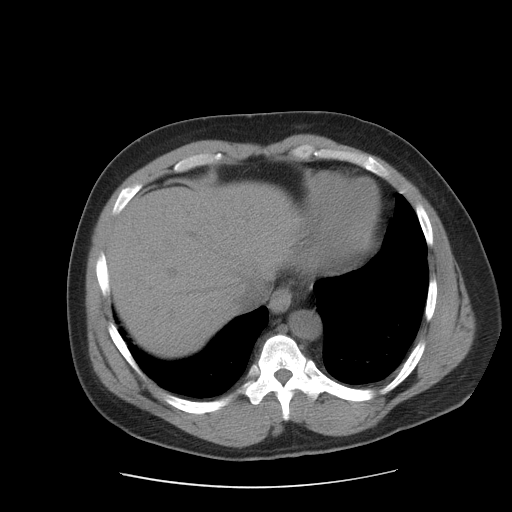
[im 83/97  lung]
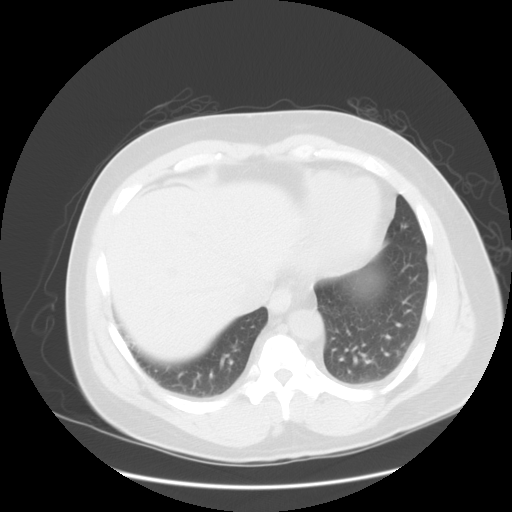

[Series 400: sag · sagittal · 1.01mm/px · 8 of 131 slices shown]
[im 14/131  soft-tissue]
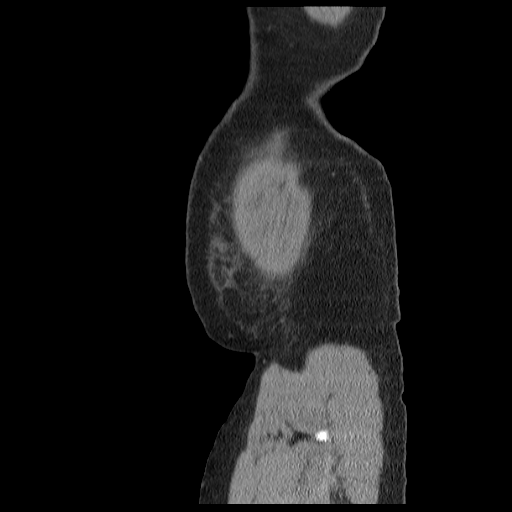
[im 27/131  soft-tissue]
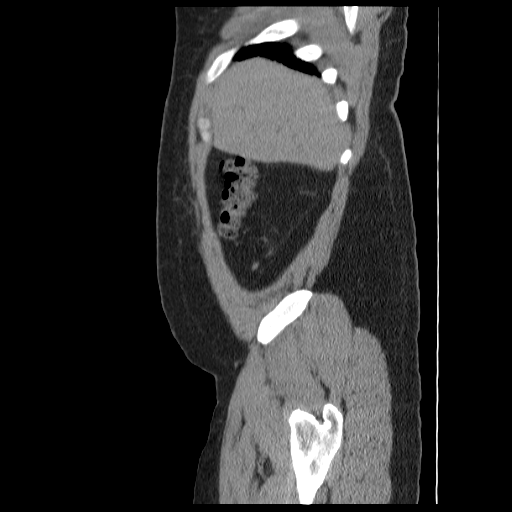
[im 40/131  soft-tissue]
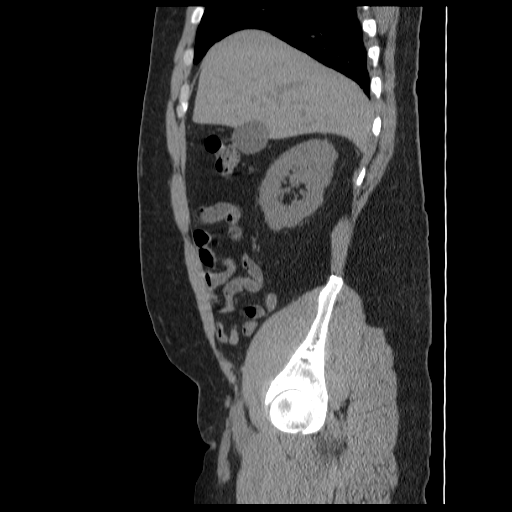
[im 53/131  soft-tissue]
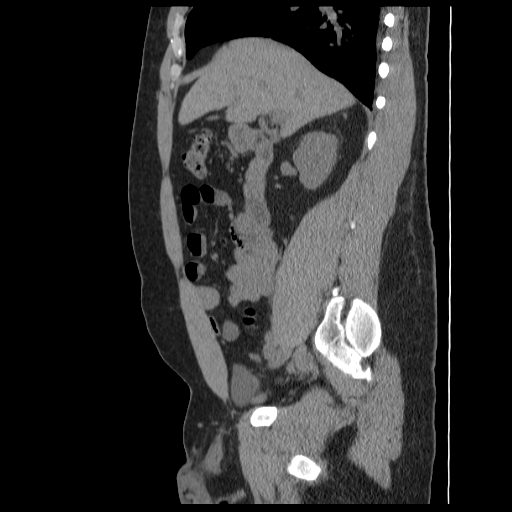
[im 79/131  soft-tissue]
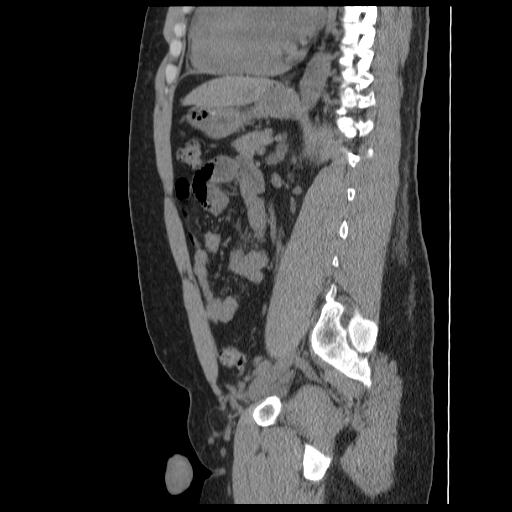
[im 92/131  soft-tissue]
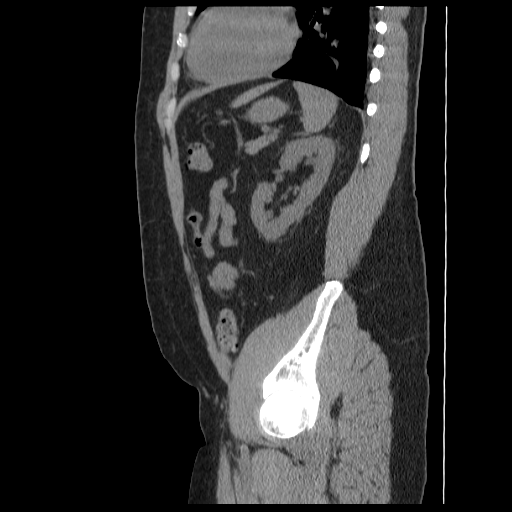
[im 105/131  soft-tissue]
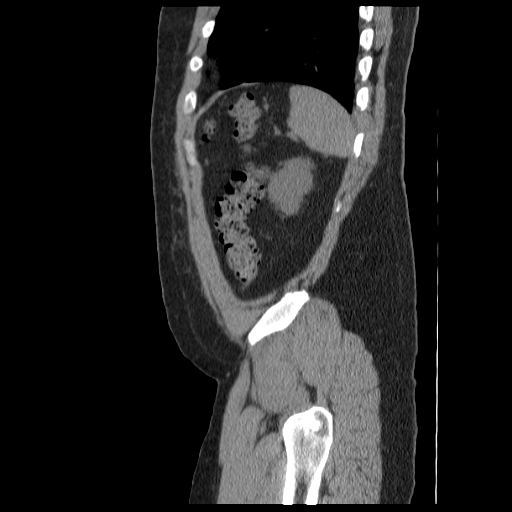
[im 118/131  soft-tissue]
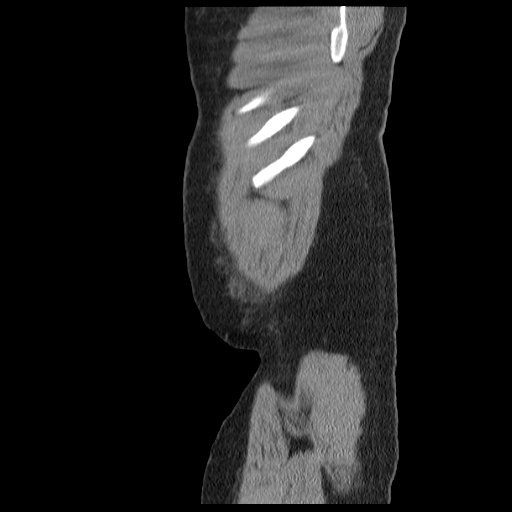

[14 of 32 positions shown; findings below may reference images not displayed]

FINDINGS: The lung bases are clear.  Small esophageal hiatal
hernia.  Mild central atrophy and both kidneys.  No
pyelocaliectasis or ureterectasis on either kidney.  No renal or
ureteral stones are demonstrated.  The bladder wall is not
thickened.  No bladder stones are seen.  Bilateral lower pole
calculi seen previously are no longer apparent today.

The unenhanced liver, spleen, gallbladder, pancreas, adrenal
glands, decompressed stomach, decompressed small bowel, abdominal
aorta, and retroperitoneal lymph nodes are unremarkable.  No free
fluid or free air in the abdomen.

Pelvis:  No free or loculated pelvic fluid collections.  Scattered
diverticula in the sigmoid colon without inflammatory change.  The
appendix is normal.  Fat in the inguinal canals.  Degenerative
changes in the lumbar spine with normal alignment.
IMPRESSION: Bilateral central renal atrophy.  No renal or ureteral stone or
obstruction demonstrated.  No bladder wall thickening or bladder
stone.

## 2013-01-04 ENCOUNTER — Telehealth: Payer: Self-pay | Admitting: Hematology & Oncology

## 2013-01-04 ENCOUNTER — Ambulatory Visit: Payer: BC Managed Care – PPO

## 2013-01-04 ENCOUNTER — Other Ambulatory Visit: Payer: BC Managed Care – PPO | Admitting: Lab

## 2013-01-04 NOTE — Telephone Encounter (Signed)
Patient  Called and cx 01/04/13 apt due to being sick.  He stated he will call back to resch

## 2013-01-04 NOTE — Telephone Encounter (Signed)
Patient called and resch 01/04/13 cx apt to 01/06/13

## 2013-01-05 ENCOUNTER — Telehealth: Payer: Self-pay | Admitting: Hematology & Oncology

## 2013-01-05 NOTE — Telephone Encounter (Signed)
Pt called left message wanting to change the time on 3-13 to 845 am or change the day. I called left message I couldn't change the time and to call back to reschedule appointments.

## 2013-01-06 ENCOUNTER — Ambulatory Visit: Payer: BC Managed Care – PPO

## 2013-01-06 ENCOUNTER — Other Ambulatory Visit: Payer: BC Managed Care – PPO | Admitting: Lab

## 2013-01-13 ENCOUNTER — Other Ambulatory Visit (HOSPITAL_BASED_OUTPATIENT_CLINIC_OR_DEPARTMENT_OTHER): Payer: BC Managed Care – PPO | Admitting: Lab

## 2013-01-13 ENCOUNTER — Ambulatory Visit (HOSPITAL_BASED_OUTPATIENT_CLINIC_OR_DEPARTMENT_OTHER): Payer: BC Managed Care – PPO

## 2013-01-13 VITALS — BP 162/96 | HR 82 | Temp 97.2°F | Resp 20

## 2013-01-13 DIAGNOSIS — N189 Chronic kidney disease, unspecified: Secondary | ICD-10-CM

## 2013-01-13 DIAGNOSIS — D649 Anemia, unspecified: Secondary | ICD-10-CM

## 2013-01-13 DIAGNOSIS — D631 Anemia in chronic kidney disease: Secondary | ICD-10-CM

## 2013-01-13 LAB — CBC WITH DIFFERENTIAL (CANCER CENTER ONLY)
BASO%: 0.2 % (ref 0.0–2.0)
EOS%: 3.1 % (ref 0.0–7.0)
HCT: 24.2 % — ABNORMAL LOW (ref 38.7–49.9)
LYMPH%: 11 % — ABNORMAL LOW (ref 14.0–48.0)
MCH: 28 pg (ref 28.0–33.4)
MCHC: 32.6 g/dL (ref 32.0–35.9)
MCV: 86 fL (ref 82–98)
MONO%: 11.3 % (ref 0.0–13.0)
NEUT%: 74.4 % (ref 40.0–80.0)
Platelets: 231 10*3/uL (ref 145–400)
RDW: 14.6 % (ref 11.1–15.7)
WBC: 10.3 10*3/uL — ABNORMAL HIGH (ref 4.0–10.0)

## 2013-01-13 LAB — TECHNOLOGIST REVIEW CHCC SATELLITE

## 2013-01-13 MED ORDER — DARBEPOETIN ALFA-POLYSORBATE 300 MCG/0.6ML IJ SOLN
300.0000 ug | Freq: Once | INTRAMUSCULAR | Status: AC
  Administered 2013-01-13: 300 ug via SUBCUTANEOUS

## 2013-01-13 NOTE — Patient Instructions (Signed)
Darbepoetin Alfa injection What is this medicine? DARBEPOETIN ALFA (dar be POE e tin AL fa) helps your body make more red blood cells. It is used to treat anemia caused by chronic kidney failure and chemotherapy. This medicine may be used for other purposes; ask your health care provider or pharmacist if you have questions. What should I tell my health care provider before I take this medicine? They need to know if you have any of these conditions: -blood clotting disorders or history of blood clots -cancer patient not on chemotherapy -cystic fibrosis -heart disease, such as angina, heart failure, or a history of a heart attack -hemoglobin level of 12 g/dL or greater -high blood pressure -low levels of folate, iron, or vitamin B12 -seizures -an unusual or allergic reaction to darbepoetin, erythropoietin, albumin, hamster proteins, latex, other medicines, foods, dyes, or preservatives -pregnant or trying to get pregnant -breast-feeding How should I use this medicine? This medicine is for injection into a vein or under the skin. It is usually given by a health care professional in a hospital or clinic setting. If you get this medicine at home, you will be taught how to prepare and give this medicine. Do not shake the solution before you withdraw a dose. Use exactly as directed. Take your medicine at regular intervals. Do not take your medicine more often than directed. It is important that you put your used needles and syringes in a special sharps container. Do not put them in a trash can. If you do not have a sharps container, call your pharmacist or healthcare provider to get one. Talk to your pediatrician regarding the use of this medicine in children. While this medicine may be used in children as young as 1 year for selected conditions, precautions do apply. Overdosage: If you think you have taken too much of this medicine contact a poison control center or emergency room at once. NOTE:  This medicine is only for you. Do not share this medicine with others. What if I miss a dose? If you miss a dose, take it as soon as you can. If it is almost time for your next dose, take only that dose. Do not take double or extra doses. What may interact with this medicine? Do not take this medicine with any of the following medications: -epoetin alfa This list may not describe all possible interactions. Give your health care provider a list of all the medicines, herbs, non-prescription drugs, or dietary supplements you use. Also tell them if you smoke, drink alcohol, or use illegal drugs. Some items may interact with your medicine. What should I watch for while using this medicine? Visit your prescriber or health care professional for regular checks on your progress and for the needed blood tests and blood pressure measurements. It is especially important for the doctor to make sure your hemoglobin level is in the desired range, to limit the risk of potential side effects and to give you the best benefit. Keep all appointments for any recommended tests. Check your blood pressure as directed. Ask your doctor what your blood pressure should be and when you should contact him or her. As your body makes more red blood cells, you may need to take iron, folic acid, or vitamin B supplements. Ask your doctor or health care provider which products are right for you. If you have kidney disease continue dietary restrictions, even though this medication can make you feel better. Talk with your doctor or health care professional about the   foods you eat and the vitamins that you take. What side effects may I notice from receiving this medicine? Side effects that you should report to your doctor or health care professional as soon as possible: -allergic reactions like skin rash, itching or hives, swelling of the face, lips, or tongue -breathing problems -changes in vision -chest pain -confusion, trouble speaking  or understanding -feeling faint or lightheaded, falls -high blood pressure -muscle aches or pains -pain, swelling, warmth in the leg -rapid weight gain -severe headaches -sudden numbness or weakness of the face, arm or leg -trouble walking, dizziness, loss of balance or coordination -seizures (convulsions) -swelling of the ankles, feet, hands -unusually weak or tired Side effects that usually do not require medical attention (report to your doctor or health care professional if they continue or are bothersome): -diarrhea -fever, chills (flu-like symptoms) -headaches -nausea, vomiting -redness, stinging, or swelling at site where injected This list may not describe all possible side effects. Call your doctor for medical advice about side effects. You may report side effects to FDA at 1-800-FDA-1088. Where should I keep my medicine? Keep out of the reach of children. Store in a refrigerator between 2 and 8 degrees C (36 and 46 degrees F). Do not freeze. Do not shake. Throw away any unused portion if using a single-dose vial. Throw away any unused medicine after the expiration date. NOTE: This sheet is a summary. It may not cover all possible information. If you have questions about this medicine, talk to your doctor, pharmacist, or health care provider.  2013, [employer]. (09/26/2008 10:23:57 AM)  

## 2013-02-02 ENCOUNTER — Ambulatory Visit: Payer: BC Managed Care – PPO | Admitting: Internal Medicine

## 2013-02-16 ENCOUNTER — Encounter: Payer: Self-pay | Admitting: *Deleted

## 2013-04-07 ENCOUNTER — Telehealth: Payer: Self-pay | Admitting: *Deleted

## 2013-04-07 DIAGNOSIS — Z0000 Encounter for general adult medical examination without abnormal findings: Secondary | ICD-10-CM

## 2013-04-07 DIAGNOSIS — Z125 Encounter for screening for malignant neoplasm of prostate: Secondary | ICD-10-CM

## 2013-04-07 NOTE — Telephone Encounter (Signed)
CPE labs entered.  

## 2013-04-07 NOTE — Telephone Encounter (Signed)
Message copied by Merrilyn Puma on Thu Apr 07, 2013  1:57 PM ------      Message from: Livingston Diones      Created: Fri Apr 01, 2013  9:22 AM       Pt has a cpe 05/09/13. Please put in lab work a week prior.             Thanks      [place] ------

## 2013-05-06 ENCOUNTER — Other Ambulatory Visit (INDEPENDENT_AMBULATORY_CARE_PROVIDER_SITE_OTHER): Payer: BC Managed Care – PPO

## 2013-05-06 ENCOUNTER — Telehealth: Payer: Self-pay | Admitting: *Deleted

## 2013-05-06 DIAGNOSIS — Z125 Encounter for screening for malignant neoplasm of prostate: Secondary | ICD-10-CM

## 2013-05-06 DIAGNOSIS — Z0000 Encounter for general adult medical examination without abnormal findings: Secondary | ICD-10-CM

## 2013-05-06 LAB — HEPATIC FUNCTION PANEL
ALT: 17 U/L (ref 0–53)
AST: 10 U/L (ref 0–37)
Bilirubin, Direct: 0.1 mg/dL (ref 0.0–0.3)
Total Protein: 7.8 g/dL (ref 6.0–8.3)

## 2013-05-06 LAB — LIPID PANEL
Cholesterol: 143 mg/dL (ref 0–200)
HDL: 44.6 mg/dL (ref >39.00)
Total CHOL/HDL Ratio: 3
Triglycerides: 149 mg/dL (ref 0.0–149.0)

## 2013-05-06 LAB — BASIC METABOLIC PANEL
BUN: 63 mg/dL — ABNORMAL HIGH (ref 6–23)
CO2: 23 mEq/L (ref 19–32)
Calcium: 10.4 mg/dL (ref 8.4–10.5)
Creatinine, Ser: 16.8 mg/dL (ref 0.4–1.5)
Glucose, Bld: 89 mg/dL (ref 70–99)

## 2013-05-06 LAB — CBC WITH DIFFERENTIAL/PLATELET
Basophils Absolute: 0.1 10*3/uL (ref 0.0–0.1)
Basophils Relative: 1 % (ref 0.0–3.0)
Eosinophils Absolute: 0.4 10*3/uL (ref 0.0–0.7)
Lymphocytes Relative: 22.8 % (ref 12.0–46.0)
MCHC: 32.8 g/dL (ref 30.0–36.0)
Neutrophils Relative %: 56.1 % (ref 43.0–77.0)
RBC: 4.34 Mil/uL (ref 4.22–5.81)

## 2013-05-06 NOTE — Telephone Encounter (Signed)
Left mess for patient to call back. Re: recent labs. His creatinine and GFR are critically elevated. PCP informed and wants to know if he is seeing his urologist regularly. He did inform our lab staff that he has been on dialysis since 01/17/13.

## 2013-05-06 NOTE — Telephone Encounter (Signed)
Pt informed

## 2013-05-06 NOTE — Telephone Encounter (Signed)
Called pt again. No answer. Will try again later.  

## 2013-05-09 ENCOUNTER — Encounter: Payer: BC Managed Care – PPO | Admitting: Internal Medicine

## 2013-05-13 ENCOUNTER — Encounter: Payer: Self-pay | Admitting: Internal Medicine

## 2013-05-13 ENCOUNTER — Ambulatory Visit (INDEPENDENT_AMBULATORY_CARE_PROVIDER_SITE_OTHER): Payer: BC Managed Care – PPO | Admitting: Internal Medicine

## 2013-05-13 VITALS — BP 140/88 | HR 79 | Temp 98.1°F | Ht 68.0 in | Wt 226.0 lb

## 2013-05-13 DIAGNOSIS — N189 Chronic kidney disease, unspecified: Secondary | ICD-10-CM

## 2013-05-13 DIAGNOSIS — D693 Immune thrombocytopenic purpura: Secondary | ICD-10-CM

## 2013-05-13 DIAGNOSIS — Z0000 Encounter for general adult medical examination without abnormal findings: Secondary | ICD-10-CM

## 2013-05-13 DIAGNOSIS — M109 Gout, unspecified: Secondary | ICD-10-CM

## 2013-05-13 MED ORDER — ATORVASTATIN CALCIUM 40 MG PO TABS: 40.0000 mg | ORAL_TABLET | Freq: Every day | ORAL | Status: AC

## 2013-05-13 MED ORDER — HYDROXYCHLOROQUINE SULFATE 200 MG PO TABS: 200.0000 mg | ORAL_TABLET | Freq: Two times a day (BID) | ORAL | Status: AC

## 2013-05-13 NOTE — Assessment & Plan Note (Signed)
Resolved on HD 

## 2013-05-13 NOTE — Assessment & Plan Note (Signed)
Doing ok.

## 2013-05-13 NOTE — Assessment & Plan Note (Signed)
We discussed age appropriate health related issues, including available/recomended screening tests and vaccinations. We discussed a need for adhering to healthy diet and exercise. Labs were reviewed/ordered. All questions were answered. On HD since 12/2012  

## 2013-05-13 NOTE — Assessment & Plan Note (Signed)
ESRD He switched to [facility] due to his ins On HD since 12/2012

## 2013-05-13 NOTE — Progress Notes (Signed)
  Subjective:    HPI The patient is here for a wellness exam. The patient has been doing well overall after he started HD in 3/14.  The patient needs to address  chronic hypertension that has not been well controlled with medicines; to address chronic lupus, hyperlipidemia controlled with medicines as well; and to address ESRD on HD, gout, controlled with medical treatment and diet prior.  BP Readings from Last 3 Encounters:  05/13/13 140/88  01/13/13 162/96  12/13/12 171/103   Wt Readings from Last 3 Encounters:  05/13/13 226 lb (102.513 kg)  11/23/12 235 lb (106.595 kg)  11/02/12 227 lb (102.967 kg)       Review of Systems  Constitutional: Positive for fatigue. Negative for appetite change and unexpected weight change.  HENT: Negative for nosebleeds, congestion, sore throat, sneezing, trouble swallowing and neck pain.   Eyes: Negative for itching and visual disturbance.  Respiratory: Negative for cough.   Cardiovascular: Negative for chest pain, palpitations and leg swelling.  Gastrointestinal: Negative for nausea, diarrhea, blood in stool and abdominal distention.  Genitourinary: Negative for frequency and hematuria.  Musculoskeletal: Negative for back pain, joint swelling and gait problem.  Skin: Positive for rash.  Neurological: Negative for dizziness, tremors, speech difficulty and weakness.  Psychiatric/Behavioral: Negative for suicidal ideas, confusion, sleep disturbance, dysphoric mood and agitation. The patient is not nervous/anxious.        Objective:   Physical Exam  Constitutional: He is oriented to person, place, and time. He appears well-developed.  Obese  HENT:  Mouth/Throat: Oropharynx is clear and moist.  Eyes: Conjunctivae are normal. Pupils are equal, round, and reactive to light.  Neck: Normal range of motion. No JVD present. No thyromegaly present.  Cardiovascular: Normal rate, regular rhythm, normal heart sounds and intact distal pulses.  Exam  reveals no gallop and no friction rub.   No murmur heard. Pulmonary/Chest: Effort normal and breath sounds normal. No respiratory distress. He has no wheezes. He has no rales. He exhibits no tenderness.  Abdominal: Soft. Bowel sounds are normal. He exhibits no distension and no mass. There is no tenderness. There is no rebound and no guarding.  Musculoskeletal: Normal range of motion. He exhibits no edema and no tenderness.  Lymphadenopathy:    He has no cervical adenopathy.  Neurological: He is alert and oriented to person, place, and time. He has normal reflexes. No cranial nerve deficit. He exhibits normal muscle tone. Coordination normal.  Skin: Skin is warm and dry. Rash (eczema, mild; no new lupus lesions) noted.  Psychiatric: He has a normal mood and affect. His behavior is normal. Judgment and thought content normal.  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

## 2013-06-22 NOTE — Progress Notes (Signed)
This encounter was created in error - please disregard.

## 2014-08-28 ENCOUNTER — Other Ambulatory Visit (INDEPENDENT_AMBULATORY_CARE_PROVIDER_SITE_OTHER): Payer: BC Managed Care – PPO

## 2014-08-28 ENCOUNTER — Ambulatory Visit (INDEPENDENT_AMBULATORY_CARE_PROVIDER_SITE_OTHER): Payer: BC Managed Care – PPO | Admitting: Internal Medicine

## 2014-08-28 ENCOUNTER — Encounter: Payer: Self-pay | Admitting: Internal Medicine

## 2014-08-28 VITALS — BP 130/90 | HR 80 | Temp 98.0°F | Ht 68.0 in | Wt 236.0 lb

## 2014-08-28 DIAGNOSIS — N185 Chronic kidney disease, stage 5: Secondary | ICD-10-CM

## 2014-08-28 DIAGNOSIS — E875 Hyperkalemia: Secondary | ICD-10-CM

## 2014-08-28 DIAGNOSIS — Z9889 Other specified postprocedural states: Secondary | ICD-10-CM | POA: Insufficient documentation

## 2014-08-28 DIAGNOSIS — E892 Postprocedural hypoparathyroidism: Secondary | ICD-10-CM | POA: Insufficient documentation

## 2014-08-28 DIAGNOSIS — I129 Hypertensive chronic kidney disease with stage 1 through stage 4 chronic kidney disease, or unspecified chronic kidney disease: Secondary | ICD-10-CM

## 2014-08-28 DIAGNOSIS — Z0000 Encounter for general adult medical examination without abnormal findings: Secondary | ICD-10-CM

## 2014-08-28 DIAGNOSIS — G4733 Obstructive sleep apnea (adult) (pediatric): Secondary | ICD-10-CM

## 2014-08-28 LAB — LIPID PANEL
CHOLESTEROL: 178 mg/dL (ref 0–200)
HDL: 40.4 mg/dL (ref >39.00)
LDL Cholesterol: 121 mg/dL — ABNORMAL HIGH (ref 0–99)
NonHDL: 137.6
TRIGLYCERIDES: 85 mg/dL (ref 0.0–149.0)
Total CHOL/HDL Ratio: 4
VLDL: 17 mg/dL (ref 0.0–40.0)

## 2014-08-28 LAB — CBC WITH DIFFERENTIAL/PLATELET
Basophils Absolute: 0.1 10*3/uL (ref 0.0–0.1)
Basophils Relative: 0.7 % (ref 0.0–3.0)
Eosinophils Absolute: 0.6 10*3/uL (ref 0.0–0.7)
Eosinophils Relative: 7.1 % — ABNORMAL HIGH (ref 0.0–5.0)
HEMATOCRIT: 35.7 % — AB (ref 39.0–52.0)
Hemoglobin: 11.3 g/dL — ABNORMAL LOW (ref 13.0–17.0)
LYMPHS PCT: 17.7 % (ref 12.0–46.0)
Lymphs Abs: 1.4 10*3/uL (ref 0.7–4.0)
MCHC: 31.7 g/dL (ref 30.0–36.0)
MCV: 91.7 fl (ref 78.0–100.0)
MONOS PCT: 11.9 % (ref 3.0–12.0)
Monocytes Absolute: 0.9 10*3/uL (ref 0.1–1.0)
NEUTROS ABS: 4.9 10*3/uL (ref 1.4–7.7)
Neutrophils Relative %: 62.6 % (ref 43.0–77.0)
Platelets: 197 10*3/uL (ref 150.0–400.0)
RBC: 3.9 Mil/uL — AB (ref 4.22–5.81)
RDW: 18.3 % — ABNORMAL HIGH (ref 11.5–15.5)
WBC: 7.8 10*3/uL (ref 4.0–10.5)

## 2014-08-28 LAB — BASIC METABOLIC PANEL
BUN: 60 mg/dL — AB (ref 6–23)
CHLORIDE: 102 meq/L (ref 96–112)
CO2: 23 meq/L (ref 19–32)
CREATININE: 14.4 mg/dL — AB (ref 0.4–1.5)
Calcium: 6.1 mg/dL — ABNORMAL LOW (ref 8.4–10.5)
GFR: 4.74 mL/min — CL (ref >60.00)
GLUCOSE: 74 mg/dL (ref 70–99)
POTASSIUM: 6.2 meq/L — AB (ref 3.5–5.1)
Sodium: 138 mEq/L (ref 135–145)

## 2014-08-28 LAB — HEPATIC FUNCTION PANEL
ALBUMIN: 3.5 g/dL (ref 3.5–5.2)
ALK PHOS: 158 U/L — AB (ref 39–117)
ALT: 13 U/L (ref 0–53)
AST: 9 U/L (ref 0–37)
BILIRUBIN DIRECT: 0.1 mg/dL (ref 0.0–0.3)
TOTAL PROTEIN: 7.6 g/dL (ref 6.0–8.3)
Total Bilirubin: 1 mg/dL (ref 0.2–1.2)

## 2014-08-28 LAB — URIC ACID: Uric Acid, Serum: 6.2 mg/dL (ref 4.0–7.8)

## 2014-08-28 LAB — VITAMIN B12: Vitamin B-12: 451 pg/mL (ref 211–911)

## 2014-08-28 LAB — PSA: PSA: 0.71 ng/mL (ref 0.10–4.00)

## 2014-08-28 LAB — TSH: TSH: 2.37 u[IU]/mL (ref 0.35–4.50)

## 2014-08-28 NOTE — Assessment & Plan Note (Signed)
  ESRD He switched to [facility] - Dr Ricci BarkerBleyer On HD since 12/2012

## 2014-08-28 NOTE — Progress Notes (Signed)
Pre visit review using our clinic review tool, if applicable. No additional management support is needed unless otherwise documented below in the visit note. 

## 2014-08-28 NOTE — Assessment & Plan Note (Signed)
Sleep test 

## 2014-08-28 NOTE — Patient Instructions (Signed)
CBC, CMET, Lipids, PSA

## 2014-08-28 NOTE — Assessment & Plan Note (Signed)
BP Readings from Last 3 Encounters:  08/28/14 130/90  05/13/13 140/88  01/13/13 162/96

## 2014-08-28 NOTE — Assessment & Plan Note (Signed)
We discussed age appropriate health related issues, including available/recomended screening tests and vaccinations. We discussed a need for adhering to healthy diet and exercise. Labs were reviewed/ordered. All questions were answered. On HD since 12/2012

## 2014-08-28 NOTE — Assessment & Plan Note (Signed)
[facility]

## 2014-08-28 NOTE — Progress Notes (Signed)
   Subjective:    HPI   The patient is here for a wellness exam. The patient has been doing well overall after he started HD in 3/14. C/o OSA sx's for a while - HD staff noticed...  The patient needs to address  chronic hypertension that has not been well controlled with medicines; to address chronic lupus, hyperlipidemia controlled with medicines as well; and to address ESRD on HD, gout, controlled with medical treatment and diet prior.  BP Readings from Last 3 Encounters:  08/28/14 130/90  05/13/13 140/88  01/13/13 162/96   Wt Readings from Last 3 Encounters:  08/28/14 236 lb (107.049 kg)  05/13/13 226 lb (102.513 kg)  11/23/12 235 lb (106.595 kg)       Review of Systems  Constitutional: Positive for fatigue. Negative for appetite change and unexpected weight change.  HENT: Negative for congestion, nosebleeds, sneezing, sore throat and trouble swallowing.   Eyes: Negative for itching and visual disturbance.  Respiratory: Negative for cough.   Cardiovascular: Negative for chest pain, palpitations and leg swelling.  Gastrointestinal: Negative for nausea, diarrhea, blood in stool and abdominal distention.  Genitourinary: Negative for frequency and hematuria.  Musculoskeletal: Negative for back pain, joint swelling, gait problem and neck pain.  Skin: Positive for rash.  Neurological: Negative for dizziness, tremors, speech difficulty and weakness.  Psychiatric/Behavioral: Negative for suicidal ideas, confusion, sleep disturbance, dysphoric mood and agitation. The patient is not nervous/anxious.        Objective:   Physical Exam  Constitutional: He is oriented to person, place, and time. He appears well-developed. No distress.  NAD  HENT:  Mouth/Throat: Oropharynx is clear and moist.  Eyes: Conjunctivae are normal. Pupils are equal, round, and reactive to light.  Neck: Normal range of motion. No JVD present. No thyromegaly present.  Cardiovascular: Normal rate, regular  rhythm, normal heart sounds and intact distal pulses.  Exam reveals no gallop and no friction rub.   No murmur heard. Pulmonary/Chest: Effort normal and breath sounds normal. No respiratory distress. He has no wheezes. He has no rales. He exhibits no tenderness.  Abdominal: Soft. Bowel sounds are normal. He exhibits no distension and no mass. There is no tenderness. There is no rebound and no guarding.  Musculoskeletal: Normal range of motion. He exhibits no edema or tenderness.  Lymphadenopathy:    He has no cervical adenopathy.  Neurological: He is alert and oriented to person, place, and time. He has normal reflexes. No cranial nerve deficit. He exhibits normal muscle tone. He displays a negative Romberg sign. Coordination and gait normal.  Skin: Skin is warm and dry. No rash noted.  Psychiatric: He has a normal mood and affect. His behavior is normal. Judgment and thought content normal.  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

## 2015-04-03 ENCOUNTER — Ambulatory Visit: Payer: Self-pay | Admitting: Internal Medicine

## 2015-04-05 ENCOUNTER — Encounter: Payer: Self-pay | Admitting: Family

## 2015-04-05 ENCOUNTER — Ambulatory Visit (INDEPENDENT_AMBULATORY_CARE_PROVIDER_SITE_OTHER): Payer: BLUE CROSS/BLUE SHIELD | Admitting: Family

## 2015-04-05 ENCOUNTER — Ambulatory Visit: Payer: Self-pay | Admitting: Internal Medicine

## 2015-04-05 VITALS — BP 120/80 | HR 95 | Temp 98.0°F | Resp 18 | Ht 68.0 in | Wt 231.0 lb

## 2015-04-05 DIAGNOSIS — J209 Acute bronchitis, unspecified: Secondary | ICD-10-CM

## 2015-04-05 MED ORDER — FLUTICASONE FUROATE-VILANTEROL 100-25 MCG/INH IN AEPB: 1.0000 | INHALATION_SPRAY | Freq: Every day | RESPIRATORY_TRACT | Status: DC

## 2015-04-05 NOTE — Patient Instructions (Signed)
Start BREO daily  Use Delsym or Robutussin as needed for cough   This should resolve within the next couple of weeks.   If you get worse please let us know.   Acute Bronchitis Bronchitis is inflammation of the airways that extend from the windpipe into the lungs (bronchi). The inflammation often causes mucus to develop. This leads to a cough, which is the most common symptom of bronchitis.  In acute bronchitis, the condition usually develops suddenly and goes away over time, usually in a couple weeks. Smoking, allergies, and asthma can make bronchitis worse. Repeated episodes of bronchitis may cause further lung problems.  CAUSES Acute bronchitis is most often caused by the same virus that causes a cold. The virus can spread from person to person (contagious) through coughing, sneezing, and touching contaminated objects. SIGNS AND SYMPTOMS   Cough.   Fever.   Coughing up mucus.   Body aches.   Chest congestion.   Chills.   Shortness of breath.   Sore throat.  DIAGNOSIS  Acute bronchitis is usually diagnosed through a physical exam. Your health care provider will also ask you questions about your medical history. Tests, such as chest X-rays, are sometimes done to rule out other conditions.  TREATMENT  Acute bronchitis usually goes away in a couple weeks. Oftentimes, no medical treatment is necessary. Medicines are sometimes given for relief of fever or cough. Antibiotic medicines are usually not needed but may be prescribed in certain situations. In some cases, an inhaler may be recommended to help reduce shortness of breath and control the cough. A cool mist vaporizer may also be used to help thin bronchial secretions and make it easier to clear the chest.  HOME CARE INSTRUCTIONS  Get plenty of rest.   Drink enough fluids to keep your urine clear or pale yellow (unless you have a medical condition that requires fluid restriction). Increasing fluids may help thin your  respiratory secretions (sputum) and reduce chest congestion, and it will prevent dehydration.   Take medicines only as directed by your health care provider.  If you were prescribed an antibiotic medicine, finish it all even if you start to feel better.  Avoid smoking and secondhand smoke. Exposure to cigarette smoke or irritating chemicals will make bronchitis worse. If you are a smoker, consider using nicotine gum or skin patches to help control withdrawal symptoms. Quitting smoking will help your lungs heal faster.   Reduce the chances of another bout of acute bronchitis by washing your hands frequently, avoiding people with cold symptoms, and trying not to touch your hands to your mouth, nose, or eyes.   Keep all follow-up visits as directed by your health care provider.  SEEK MEDICAL CARE IF: Your symptoms do not improve after 1 week of treatment.  SEEK IMMEDIATE MEDICAL CARE IF:  You develop an increased fever or chills.   You have chest pain.   You have severe shortness of breath.  You have bloody sputum.   You develop dehydration.  You faint or repeatedly feel like you are going to pass out.  You develop repeated vomiting.  You develop a severe headache. MAKE SURE YOU:   Understand these instructions.  Will watch your condition.  Will get help right away if you are not doing well or get worse. Document Released: 11/20/2004 Document Revised: 02/27/2014 Document Reviewed: 04/05/2013 [facility] Patient Information 2015 [place], [place]. This information is not intended to replace advice given to you by your health care provider. Make sure  you discuss any questions you have with your health care provider.

## 2015-04-05 NOTE — Assessment & Plan Note (Signed)
Symptoms and exam consistent with resolving bronchitis. Not likely bacterial as treatments with azithromycin and levofloxacin have been completed. Eye redness most likely related to coughing. Start Breo (sample provided). Continue OTC Delsym or Robutussin for cough. Informed may take a couple of weeks to resolve. Follow up if symptoms worsen or fail to improve.

## 2015-04-05 NOTE — Progress Notes (Signed)
Subjective:    Patient ID: Tyler Franco, male    DOB: 07-Oct-1966, 49 y.o.   MRN: [id number]  Chief Complaint  Patient presents with  . Cough    x3 weeks, has had a fever during the time of the cough, went to urgent care and they said he had bronchitis and then pneumonia and then said he didn't have pneumonia, has red eyes that he thinks is from a medication that urgent care gave him    HPI:  Tyler Franco is a 49 y.o. male with a PMH of dyslipidemia, gout, hyperkalemia, chronic kidney disease, lupus, and obstructive sleep apnea who presents today for an acute office visit.   Associated symptoms of cough has been going on for about 3 weeks. He was seen at an Urgent Care and x-rays revealed a questionable pneumonia. Treated with prednisone, azithromycin and levofloxacin which have not provided relief of his symptoms. Currently experiencing the associated symptom of cough which is exacerbated by speaking. Denies other symptoms of fevers, chills, or congestion. Indicates that he the cough has slighlty decreased in frequency since onset on completion of antibiotics, but the severity has continued.    Allergies  Allergen Reactions  . Ibuprofen   . Naproxen Sodium     Current Outpatient Prescriptions on File Prior to Visit  Medication Sig Dispense Refill  . amLODipine (NORVASC) 5 MG tablet Take 1 tablet (5 mg total) by mouth daily. 90 tablet 3  . labetalol (NORMODYNE) 300 MG tablet Take 1 tablet (300 mg total) by mouth 3 (three) times daily. 270 tablet 3   No current facility-administered medications on file prior to visit.    Review of Systems  Constitutional: Negative for fever and chills.  HENT: Negative for congestion, sinus pressure, sore throat, trouble swallowing and voice change.   Respiratory: Positive for cough. Negative for chest tightness and shortness of breath.   Gastrointestinal: Negative for nausea.  Neurological: Negative for headaches.      Objective:    BP 120/80 mmHg  Pulse 95  Temp(Src) 98 F (36.7 C) (Oral)  Resp 18  Ht  (1.727 m)  Wt 231 lb (104.781 kg)  BMI 35.13 kg/m2  SpO2 95% Nursing note and vital signs reviewed.  Physical Exam  Constitutional: He is oriented to person, place, and time. He appears well-developed and well-nourished. No distress.  HENT:  Right Ear: Hearing, tympanic membrane, external ear and ear canal normal.  Left Ear: Hearing, tympanic membrane, external ear and ear canal normal.  Nose: Nose normal. Right sinus exhibits no maxillary sinus tenderness and no frontal sinus tenderness. Left sinus exhibits no maxillary sinus tenderness and no frontal sinus tenderness.  Mouth/Throat: Uvula is midline, oropharynx is clear and moist and mucous membranes are normal.  Eyes: Right conjunctiva is injected. Left conjunctiva is injected.  Neck: Neck supple.  Cardiovascular: Normal rate, regular rhythm, normal heart sounds and intact distal pulses.   Pulmonary/Chest: Effort normal and breath sounds normal.  Lymphadenopathy:    He has no cervical adenopathy.  Neurological: He is alert and oriented to person, place, and time.  Skin: Skin is warm and dry.  Psychiatric: He has a normal mood and affect. His behavior is normal. Judgment and thought content normal.       Assessment & Plan:   Problem List Items Addressed This Visit      Respiratory   Acute bronchitis - Primary    Symptoms and exam consistent with resolving bronchitis. Not  likely bacterial as treatments with azithromycin and levofloxacin have been completed. Eye redness most likely related to coughing. Start Breo (sample provided). Continue OTC Delsym or Robutussin for cough. Informed may take a couple of weeks to resolve. Follow up if symptoms worsen or fail to improve.       Relevant Medications   Fluticasone Furoate-Vilanterol (BREO ELLIPTA) 100-25 MCG/INH AEPB

## 2015-04-05 NOTE — Progress Notes (Signed)
Pre visit review using our clinic review tool, if applicable. No additional management support is needed unless otherwise documented below in the visit note. 

## 2015-04-18 ENCOUNTER — Encounter: Payer: Self-pay | Admitting: Internal Medicine

## 2015-04-18 ENCOUNTER — Ambulatory Visit (INDEPENDENT_AMBULATORY_CARE_PROVIDER_SITE_OTHER): Payer: BLUE CROSS/BLUE SHIELD | Admitting: Internal Medicine

## 2015-04-18 VITALS — BP 166/90 | HR 85 | Ht 68.0 in | Wt 230.0 lb

## 2015-04-18 DIAGNOSIS — K921 Melena: Secondary | ICD-10-CM | POA: Diagnosis not present

## 2015-04-18 DIAGNOSIS — Z0000 Encounter for general adult medical examination without abnormal findings: Secondary | ICD-10-CM

## 2015-04-18 DIAGNOSIS — Z23 Encounter for immunization: Secondary | ICD-10-CM

## 2015-04-18 DIAGNOSIS — G4733 Obstructive sleep apnea (adult) (pediatric): Secondary | ICD-10-CM

## 2015-04-18 DIAGNOSIS — E785 Hyperlipidemia, unspecified: Secondary | ICD-10-CM | POA: Diagnosis not present

## 2015-04-18 DIAGNOSIS — R0683 Snoring: Secondary | ICD-10-CM

## 2015-04-18 DIAGNOSIS — N185 Chronic kidney disease, stage 5: Secondary | ICD-10-CM

## 2015-04-18 DIAGNOSIS — I129 Hypertensive chronic kidney disease with stage 1 through stage 4 chronic kidney disease, or unspecified chronic kidney disease: Secondary | ICD-10-CM

## 2015-04-18 MED ORDER — LABETALOL HCL 200 MG PO TABS: ORAL_TABLET | ORAL | Status: AC

## 2015-04-18 NOTE — Assessment & Plan Note (Addendum)
ESRD  [facility] Nephrology - Dr Ricci Barker On HD since 12/2012 Labetalol and Terazosyn qd

## 2015-04-18 NOTE — Assessment & Plan Note (Signed)
Pt will check w/Dr Ricci Barker re: statin

## 2015-04-18 NOTE — Progress Notes (Signed)
Pre visit review using our clinic review tool, if applicable. No additional management support is needed unless otherwise documented below in the visit note. 

## 2015-04-18 NOTE — Progress Notes (Signed)
Subjective:    HPI   The patient is here for a wellness exam. The patient has been doing well overall after he started HD in 3/14 - Dr Deterding. C/o OSA sx's for a while - HD staff noticed.Tyler KitchenMarland KitchenHe has not been to sleep med yet. There was blood in stool several times  The patient needs to address  chronic hypertension that has not been well controlled with medicines; to address chronic lupus, hyperlipidemia controlled with medicines as well; and to address ESRD on HD, gout, controlled with medical treatment and diet prior.  BP Readings from Last 3 Encounters:  04/18/15 166/90  04/05/15 120/80  08/28/14 130/90   Wt Readings from Last 3 Encounters:  04/18/15 230 lb (104.327 kg)  04/05/15 231 lb (104.781 kg)  08/28/14 236 lb (107.049 kg)       Review of Systems  Constitutional: Positive for fatigue. Negative for appetite change and unexpected weight change.  HENT: Negative for congestion, nosebleeds, sneezing, sore throat and trouble swallowing.   Eyes: Negative for itching and visual disturbance.  Respiratory: Negative for cough.   Cardiovascular: Negative for chest pain, palpitations and leg swelling.  Gastrointestinal: Negative for nausea, diarrhea, blood in stool and abdominal distention.  Genitourinary: Negative for frequency and hematuria.  Musculoskeletal: Negative for back pain, joint swelling, gait problem and neck pain.  Skin: Positive for rash.  Neurological: Negative for dizziness, tremors, speech difficulty and weakness.  Psychiatric/Behavioral: Negative for suicidal ideas, confusion, sleep disturbance, dysphoric mood and agitation. The patient is not nervous/anxious.        Objective:   Physical Exam  Constitutional: He is oriented to person, place, and time. He appears well-developed. No distress.  Obese NAD  HENT:  Mouth/Throat: Oropharynx is clear and moist.  Eyes: Conjunctivae are normal. Pupils are equal, round, and reactive to light.  Conj erythema   Neck: Normal range of motion. No JVD present. No thyromegaly present.  Cardiovascular: Normal rate, regular rhythm, normal heart sounds and intact distal pulses.  Exam reveals no gallop and no friction rub.   No murmur heard. Pulmonary/Chest: Effort normal and breath sounds normal. No respiratory distress. He has no wheezes. He has no rales. He exhibits no tenderness.  Abdominal: Soft. Bowel sounds are normal. He exhibits no distension and no mass. There is no tenderness. There is no rebound and no guarding.  Musculoskeletal: Normal range of motion. He exhibits no edema or tenderness.  Lymphadenopathy:    He has no cervical adenopathy.  Neurological: He is alert and oriented to person, place, and time. He has normal reflexes. No cranial nerve deficit. He exhibits normal muscle tone. He displays a negative Romberg sign. Coordination and gait normal.  Skin: Skin is warm and dry. No rash noted.  Psychiatric: He has a normal mood and affect. His behavior is normal. Judgment and thought content normal.  Rect WNL  Stool G (-) L wrist AV shunt  Lab Results  Component Value Date   WBC 7.8 08/28/2014   HGB 11.3* 08/28/2014   HCT 35.7* 08/28/2014   PLT 197.0 08/28/2014   CHOL 178 08/28/2014   TRIG 85.0 08/28/2014   HDL 40.40 08/28/2014   LDLDIRECT 122.3 04/28/2007   ALT 13 08/28/2014   AST 9 08/28/2014   NA 138 08/28/2014   K 6.2* 08/28/2014   CL 102 08/28/2014   CREATININE 14.4* 08/28/2014   BUN 60* 08/28/2014   CO2 23 08/28/2014   TSH 2.37 08/28/2014   PSA 0.71 08/28/2014  Assessment & Plan:

## 2015-04-18 NOTE — Assessment & Plan Note (Signed)
Pulm ref at [facility]

## 2015-04-18 NOTE — Assessment & Plan Note (Signed)
ESRD  [facility] Nephrology - Dr Ricci Barker On HD since 12/2012

## 2015-04-18 NOTE — Assessment & Plan Note (Signed)
GI ref [facility]

## 2015-04-18 NOTE — Assessment & Plan Note (Signed)
We discussed age appropriate health related issues, including available/recomended screening tests and vaccinations. We discussed a need for adhering to healthy diet and exercise. Labs were reviewed/ordered. All questions were answered. On HD since 12/2012  

## 2015-07-16 ENCOUNTER — Encounter: Payer: Self-pay | Admitting: Internal Medicine

## 2015-07-16 ENCOUNTER — Ambulatory Visit (INDEPENDENT_AMBULATORY_CARE_PROVIDER_SITE_OTHER): Payer: BLUE CROSS/BLUE SHIELD | Admitting: Internal Medicine

## 2015-07-16 VITALS — BP 164/96 | HR 71 | Temp 98.1°F | Resp 16 | Wt 236.0 lb

## 2015-07-16 DIAGNOSIS — R1011 Right upper quadrant pain: Secondary | ICD-10-CM | POA: Diagnosis not present

## 2015-07-16 DIAGNOSIS — T148XXA Other injury of unspecified body region, initial encounter: Secondary | ICD-10-CM

## 2015-07-16 DIAGNOSIS — T148 Other injury of unspecified body region: Secondary | ICD-10-CM

## 2015-07-16 DIAGNOSIS — N185 Chronic kidney disease, stage 5: Secondary | ICD-10-CM

## 2015-07-16 DIAGNOSIS — D693 Immune thrombocytopenic purpura: Secondary | ICD-10-CM | POA: Diagnosis not present

## 2015-07-16 DIAGNOSIS — M329 Systemic lupus erythematosus, unspecified: Secondary | ICD-10-CM | POA: Diagnosis not present

## 2015-07-16 DIAGNOSIS — K921 Melena: Secondary | ICD-10-CM

## 2015-07-16 NOTE — Progress Notes (Signed)
Pre visit review using our clinic review tool, if applicable. No additional management support is needed unless otherwise documented below in the visit note. 

## 2015-07-16 NOTE — Progress Notes (Signed)
Subjective:    Patient ID: Tyler Franco, male    DOB: 08-05-1966, 49 y.o.   MRN: [id number]  HPI   He began to have pain in the right upper lateral abdominal area 06/27/15. It was described as a level V out of 10. As of 9/15 he noted a bruise in this area. He went to urgent care  & was referred to [facility] ER. Apparently an ultrasound was performed in the ER . Based on the findings it was recommended he contact his PCP in [place] to schedule a CT scan . This may have been an unofficial portable ultrasound study as the results could not be found in the computer under Care Everywhere. A formal chest x-ray report was reviewed; this revealed no active process. He has no definite cardiovascular or significant genitourinary symptoms.   He has had some dysuria occasionally . He essentially does not urinate as he is on dialysis Tuesday, Thursday, and Saturday.  He has end-stage kidney disease for which he is followed at [facility] by Dr. Ricci Barker. His last creatinine was 17 on 8/11. He has been on hemodialysis since March 2014. This is in the context of lupus with associated history of immune thrombocytopenic purpura.His most recent platelet count was 201,000 and hemoglobin 11.6 on 8/25.  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 Calcium was 10.8 on 8/6.   He's been taking Aleve since 9/18 with improvement in the discomfort. When it wears off the pain recurs. The pain is worse when he stretches forward as if to touch his toes or in the right lateral decubitus position.  He is had chronic rectal bleeding; colonoscopy is scheduled 9/21.  He's also followed for obstructive sleep apnea at [facility];F/U is pending.  Review of Systems Epistaxis, hemoptysis, hematuria, or melena denied. No unexplained weight loss, significant dyspepsia,or dysphagia.  Usually there is no abnormal bruising , bleeding, or difficulty stopping bleeding with injury despite the history of immune  thrombocytopenic purpura.   He denies radicular pain into the lower abdomen. Also he denies numbness, tingling, weakness in lower extremities.     Objective:   Physical Exam  Pertinent or positive findings include:  His head is shaven; he has a goatee. There are  dialysis shunt related deformities of the left upper extremity. He has keloid type scars in the right  forearm area. There is a 7 x 4.5 area of ecchymosis over the right upper lateral abdominal area. He has a 2 x 1.5 subcutaneous granulomatous change in this area.  General appearance :adequately nourished; in no distress.  Eyes: No conjunctival inflammation or scleral icterus is present.  Oral exam:  Lips and gums are healthy appearing.There is no oropharyngeal erythema or exudate noted. Dental hygiene is good.  Heart:  Normal rate and regular rhythm. S1 and S2 normal without gallop, murmur, click, rub or other extra sounds    Lungs:Chest clear to auscultation; no wheezes, rhonchi,rales ,or rubs present.No increased work of breathing.   Abdomen: bowel sounds normal, soft and non-tender without masses, organomegaly or hernias noted.  No guarding or rebound. No flank tenderness to percussion.  Vascular : all pulses equal ; no bruits present.  Skin:Warm & dry.  Intact without suspicious lesions or rashes ; no tenting   Lymphatic: No lymphadenopathy is noted about the head, neck, axilla   Neuro: Strength, tone & DTRs normal.      Assessment & Plan:  #1 abdominal discomfort right upper/lateral abdominal area.  Thoracic radiculopathy or musculoskeletal etiology suggested by the positionality factors and response to Aleve.  #2 ecchymosis and SQ granuloma in this area without definite trigger or injury  #3 History of immune thrombocytopenic purpura ; platelet count within normal limit 8/25.  #4 recurrent rectal bleeding; colonoscopy pending  Plan: I've recommended that he use warm moist compresses in the area of the bruise  and continue the Aleve. If the colonoscopy is non-diagnostic in reference to the hepatic flexure area; thoracic spine films would be the least invasive procedure. CT could be pursued if thoracic films and colonoscopy are negative and if CT okay with Dr.Bleyer.  He now lives in [place] and plans to affiliate with a primary care physician in the [facility] system. This is most appropriate to enhance continuity of care

## 2015-07-16 NOTE — Patient Instructions (Addendum)
Use warm moist compresses 3- 4 times a day to the affected area. I'll contact Dr Ricci Barker to co-ordinate further workup.

## 2015-07-18 ENCOUNTER — Telehealth: Payer: Self-pay | Admitting: Emergency Medicine

## 2015-07-18 NOTE — Telephone Encounter (Signed)
Faxed last OV notes to Dr Ricci Barker, [facility] [phone]

## 2016-07-29 DIAGNOSIS — N2581 Secondary hyperparathyroidism of renal origin: Secondary | ICD-10-CM | POA: Diagnosis not present

## 2016-07-29 DIAGNOSIS — Z23 Encounter for immunization: Secondary | ICD-10-CM | POA: Diagnosis not present

## 2016-07-29 DIAGNOSIS — D631 Anemia in chronic kidney disease: Secondary | ICD-10-CM | POA: Diagnosis not present

## 2016-07-29 DIAGNOSIS — D509 Iron deficiency anemia, unspecified: Secondary | ICD-10-CM | POA: Diagnosis not present

## 2016-07-29 DIAGNOSIS — N186 End stage renal disease: Secondary | ICD-10-CM | POA: Diagnosis not present

## 2016-07-31 DIAGNOSIS — Z23 Encounter for immunization: Secondary | ICD-10-CM | POA: Diagnosis not present

## 2016-07-31 DIAGNOSIS — N2581 Secondary hyperparathyroidism of renal origin: Secondary | ICD-10-CM | POA: Diagnosis not present

## 2016-07-31 DIAGNOSIS — N186 End stage renal disease: Secondary | ICD-10-CM | POA: Diagnosis not present

## 2016-07-31 DIAGNOSIS — D631 Anemia in chronic kidney disease: Secondary | ICD-10-CM | POA: Diagnosis not present

## 2016-07-31 DIAGNOSIS — D509 Iron deficiency anemia, unspecified: Secondary | ICD-10-CM | POA: Diagnosis not present

## 2016-08-02 DIAGNOSIS — N2581 Secondary hyperparathyroidism of renal origin: Secondary | ICD-10-CM | POA: Diagnosis not present

## 2016-08-02 DIAGNOSIS — D509 Iron deficiency anemia, unspecified: Secondary | ICD-10-CM | POA: Diagnosis not present

## 2016-08-02 DIAGNOSIS — D631 Anemia in chronic kidney disease: Secondary | ICD-10-CM | POA: Diagnosis not present

## 2016-08-02 DIAGNOSIS — N186 End stage renal disease: Secondary | ICD-10-CM | POA: Diagnosis not present

## 2016-08-02 DIAGNOSIS — Z23 Encounter for immunization: Secondary | ICD-10-CM | POA: Diagnosis not present

## 2016-08-04 ENCOUNTER — Encounter: Payer: BLUE CROSS/BLUE SHIELD | Admitting: Internal Medicine

## 2016-08-04 DIAGNOSIS — E785 Hyperlipidemia, unspecified: Secondary | ICD-10-CM | POA: Diagnosis not present

## 2016-08-04 DIAGNOSIS — M329 Systemic lupus erythematosus, unspecified: Secondary | ICD-10-CM | POA: Diagnosis not present

## 2016-08-04 DIAGNOSIS — N400 Enlarged prostate without lower urinary tract symptoms: Secondary | ICD-10-CM | POA: Diagnosis not present

## 2016-08-04 DIAGNOSIS — Z6835 Body mass index (BMI) 35.0-35.9, adult: Secondary | ICD-10-CM | POA: Diagnosis not present

## 2016-08-04 DIAGNOSIS — E669 Obesity, unspecified: Secondary | ICD-10-CM | POA: Diagnosis not present

## 2016-08-04 DIAGNOSIS — R300 Dysuria: Secondary | ICD-10-CM | POA: Diagnosis not present

## 2016-08-04 DIAGNOSIS — G4733 Obstructive sleep apnea (adult) (pediatric): Secondary | ICD-10-CM | POA: Diagnosis not present

## 2016-08-04 DIAGNOSIS — K219 Gastro-esophageal reflux disease without esophagitis: Secondary | ICD-10-CM | POA: Diagnosis not present

## 2016-08-04 DIAGNOSIS — Z9989 Dependence on other enabling machines and devices: Secondary | ICD-10-CM | POA: Diagnosis not present

## 2016-08-04 DIAGNOSIS — R39198 Other difficulties with micturition: Secondary | ICD-10-CM | POA: Diagnosis not present

## 2016-08-04 DIAGNOSIS — Z888 Allergy status to other drugs, medicaments and biological substances status: Secondary | ICD-10-CM | POA: Diagnosis not present

## 2016-08-04 DIAGNOSIS — I120 Hypertensive chronic kidney disease with stage 5 chronic kidney disease or end stage renal disease: Secondary | ICD-10-CM | POA: Diagnosis not present

## 2016-08-04 DIAGNOSIS — Z992 Dependence on renal dialysis: Secondary | ICD-10-CM | POA: Diagnosis not present

## 2016-08-04 DIAGNOSIS — Z79899 Other long term (current) drug therapy: Secondary | ICD-10-CM | POA: Diagnosis not present

## 2016-08-04 DIAGNOSIS — N186 End stage renal disease: Secondary | ICD-10-CM | POA: Diagnosis not present

## 2016-08-04 DIAGNOSIS — Z886 Allergy status to analgesic agent status: Secondary | ICD-10-CM | POA: Diagnosis not present

## 2016-08-05 DIAGNOSIS — N186 End stage renal disease: Secondary | ICD-10-CM | POA: Diagnosis not present

## 2016-08-05 DIAGNOSIS — N2581 Secondary hyperparathyroidism of renal origin: Secondary | ICD-10-CM | POA: Diagnosis not present

## 2016-08-05 DIAGNOSIS — D631 Anemia in chronic kidney disease: Secondary | ICD-10-CM | POA: Diagnosis not present

## 2016-08-05 DIAGNOSIS — D509 Iron deficiency anemia, unspecified: Secondary | ICD-10-CM | POA: Diagnosis not present

## 2016-08-05 DIAGNOSIS — Z23 Encounter for immunization: Secondary | ICD-10-CM | POA: Diagnosis not present

## 2016-08-07 DIAGNOSIS — D509 Iron deficiency anemia, unspecified: Secondary | ICD-10-CM | POA: Diagnosis not present

## 2016-08-07 DIAGNOSIS — Z23 Encounter for immunization: Secondary | ICD-10-CM | POA: Diagnosis not present

## 2016-08-07 DIAGNOSIS — N186 End stage renal disease: Secondary | ICD-10-CM | POA: Diagnosis not present

## 2016-08-07 DIAGNOSIS — D631 Anemia in chronic kidney disease: Secondary | ICD-10-CM | POA: Diagnosis not present

## 2016-08-07 DIAGNOSIS — N2581 Secondary hyperparathyroidism of renal origin: Secondary | ICD-10-CM | POA: Diagnosis not present

## 2016-08-09 DIAGNOSIS — N186 End stage renal disease: Secondary | ICD-10-CM | POA: Diagnosis not present

## 2016-08-09 DIAGNOSIS — D509 Iron deficiency anemia, unspecified: Secondary | ICD-10-CM | POA: Diagnosis not present

## 2016-08-09 DIAGNOSIS — N2581 Secondary hyperparathyroidism of renal origin: Secondary | ICD-10-CM | POA: Diagnosis not present

## 2016-08-09 DIAGNOSIS — D631 Anemia in chronic kidney disease: Secondary | ICD-10-CM | POA: Diagnosis not present

## 2016-08-09 DIAGNOSIS — Z23 Encounter for immunization: Secondary | ICD-10-CM | POA: Diagnosis not present

## 2016-08-12 DIAGNOSIS — N186 End stage renal disease: Secondary | ICD-10-CM | POA: Diagnosis not present

## 2016-08-12 DIAGNOSIS — D631 Anemia in chronic kidney disease: Secondary | ICD-10-CM | POA: Diagnosis not present

## 2016-08-12 DIAGNOSIS — D509 Iron deficiency anemia, unspecified: Secondary | ICD-10-CM | POA: Diagnosis not present

## 2016-08-12 DIAGNOSIS — Z23 Encounter for immunization: Secondary | ICD-10-CM | POA: Diagnosis not present

## 2016-08-12 DIAGNOSIS — N2581 Secondary hyperparathyroidism of renal origin: Secondary | ICD-10-CM | POA: Diagnosis not present

## 2016-08-14 DIAGNOSIS — D509 Iron deficiency anemia, unspecified: Secondary | ICD-10-CM | POA: Diagnosis not present

## 2016-08-14 DIAGNOSIS — D631 Anemia in chronic kidney disease: Secondary | ICD-10-CM | POA: Diagnosis not present

## 2016-08-14 DIAGNOSIS — Z23 Encounter for immunization: Secondary | ICD-10-CM | POA: Diagnosis not present

## 2016-08-14 DIAGNOSIS — N2581 Secondary hyperparathyroidism of renal origin: Secondary | ICD-10-CM | POA: Diagnosis not present

## 2016-08-14 DIAGNOSIS — N186 End stage renal disease: Secondary | ICD-10-CM | POA: Diagnosis not present

## 2016-08-15 DIAGNOSIS — N186 End stage renal disease: Secondary | ICD-10-CM | POA: Diagnosis not present

## 2016-08-15 DIAGNOSIS — D631 Anemia in chronic kidney disease: Secondary | ICD-10-CM | POA: Diagnosis not present

## 2016-08-15 DIAGNOSIS — N2581 Secondary hyperparathyroidism of renal origin: Secondary | ICD-10-CM | POA: Diagnosis not present

## 2016-08-15 DIAGNOSIS — Z23 Encounter for immunization: Secondary | ICD-10-CM | POA: Diagnosis not present

## 2016-08-16 DIAGNOSIS — Z23 Encounter for immunization: Secondary | ICD-10-CM | POA: Diagnosis not present

## 2016-08-16 DIAGNOSIS — N186 End stage renal disease: Secondary | ICD-10-CM | POA: Diagnosis not present

## 2016-08-16 DIAGNOSIS — N2581 Secondary hyperparathyroidism of renal origin: Secondary | ICD-10-CM | POA: Diagnosis not present

## 2016-08-16 DIAGNOSIS — D631 Anemia in chronic kidney disease: Secondary | ICD-10-CM | POA: Diagnosis not present

## 2016-08-19 DIAGNOSIS — D631 Anemia in chronic kidney disease: Secondary | ICD-10-CM | POA: Diagnosis not present

## 2016-08-19 DIAGNOSIS — N2581 Secondary hyperparathyroidism of renal origin: Secondary | ICD-10-CM | POA: Diagnosis not present

## 2016-08-19 DIAGNOSIS — Z23 Encounter for immunization: Secondary | ICD-10-CM | POA: Diagnosis not present

## 2016-08-19 DIAGNOSIS — N186 End stage renal disease: Secondary | ICD-10-CM | POA: Diagnosis not present

## 2016-08-20 ENCOUNTER — Encounter: Payer: BLUE CROSS/BLUE SHIELD | Admitting: Internal Medicine

## 2016-08-21 DIAGNOSIS — D631 Anemia in chronic kidney disease: Secondary | ICD-10-CM | POA: Diagnosis not present

## 2016-08-21 DIAGNOSIS — N2581 Secondary hyperparathyroidism of renal origin: Secondary | ICD-10-CM | POA: Diagnosis not present

## 2016-08-21 DIAGNOSIS — Z23 Encounter for immunization: Secondary | ICD-10-CM | POA: Diagnosis not present

## 2016-08-21 DIAGNOSIS — N186 End stage renal disease: Secondary | ICD-10-CM | POA: Diagnosis not present

## 2016-08-22 ENCOUNTER — Encounter: Payer: BLUE CROSS/BLUE SHIELD | Admitting: Internal Medicine

## 2016-08-23 DIAGNOSIS — N2581 Secondary hyperparathyroidism of renal origin: Secondary | ICD-10-CM | POA: Diagnosis not present

## 2016-08-23 DIAGNOSIS — Z23 Encounter for immunization: Secondary | ICD-10-CM | POA: Diagnosis not present

## 2016-08-23 DIAGNOSIS — D631 Anemia in chronic kidney disease: Secondary | ICD-10-CM | POA: Diagnosis not present

## 2016-08-23 DIAGNOSIS — N186 End stage renal disease: Secondary | ICD-10-CM | POA: Diagnosis not present

## 2016-08-26 DIAGNOSIS — N186 End stage renal disease: Secondary | ICD-10-CM | POA: Diagnosis not present

## 2016-08-26 DIAGNOSIS — Z23 Encounter for immunization: Secondary | ICD-10-CM | POA: Diagnosis not present

## 2016-08-26 DIAGNOSIS — D631 Anemia in chronic kidney disease: Secondary | ICD-10-CM | POA: Diagnosis not present

## 2016-08-26 DIAGNOSIS — N2581 Secondary hyperparathyroidism of renal origin: Secondary | ICD-10-CM | POA: Diagnosis not present

## 2016-08-26 DIAGNOSIS — Z992 Dependence on renal dialysis: Secondary | ICD-10-CM | POA: Diagnosis not present

## 2016-08-28 DIAGNOSIS — D631 Anemia in chronic kidney disease: Secondary | ICD-10-CM | POA: Diagnosis not present

## 2016-08-28 DIAGNOSIS — N2581 Secondary hyperparathyroidism of renal origin: Secondary | ICD-10-CM | POA: Diagnosis not present

## 2016-08-28 DIAGNOSIS — D509 Iron deficiency anemia, unspecified: Secondary | ICD-10-CM | POA: Diagnosis not present

## 2016-08-28 DIAGNOSIS — N186 End stage renal disease: Secondary | ICD-10-CM | POA: Diagnosis not present

## 2016-08-30 DIAGNOSIS — N186 End stage renal disease: Secondary | ICD-10-CM | POA: Diagnosis not present

## 2016-08-30 DIAGNOSIS — D509 Iron deficiency anemia, unspecified: Secondary | ICD-10-CM | POA: Diagnosis not present

## 2016-08-30 DIAGNOSIS — D631 Anemia in chronic kidney disease: Secondary | ICD-10-CM | POA: Diagnosis not present

## 2016-08-30 DIAGNOSIS — N2581 Secondary hyperparathyroidism of renal origin: Secondary | ICD-10-CM | POA: Diagnosis not present

## 2016-09-02 DIAGNOSIS — N2581 Secondary hyperparathyroidism of renal origin: Secondary | ICD-10-CM | POA: Diagnosis not present

## 2016-09-02 DIAGNOSIS — N186 End stage renal disease: Secondary | ICD-10-CM | POA: Diagnosis not present

## 2016-09-02 DIAGNOSIS — D631 Anemia in chronic kidney disease: Secondary | ICD-10-CM | POA: Diagnosis not present

## 2016-09-02 DIAGNOSIS — D509 Iron deficiency anemia, unspecified: Secondary | ICD-10-CM | POA: Diagnosis not present

## 2016-09-04 DIAGNOSIS — N186 End stage renal disease: Secondary | ICD-10-CM | POA: Diagnosis not present

## 2016-09-04 DIAGNOSIS — N2581 Secondary hyperparathyroidism of renal origin: Secondary | ICD-10-CM | POA: Diagnosis not present

## 2016-09-04 DIAGNOSIS — D509 Iron deficiency anemia, unspecified: Secondary | ICD-10-CM | POA: Diagnosis not present

## 2016-09-04 DIAGNOSIS — D631 Anemia in chronic kidney disease: Secondary | ICD-10-CM | POA: Diagnosis not present

## 2016-09-06 DIAGNOSIS — N2581 Secondary hyperparathyroidism of renal origin: Secondary | ICD-10-CM | POA: Diagnosis not present

## 2016-09-06 DIAGNOSIS — N186 End stage renal disease: Secondary | ICD-10-CM | POA: Diagnosis not present

## 2016-09-06 DIAGNOSIS — D631 Anemia in chronic kidney disease: Secondary | ICD-10-CM | POA: Diagnosis not present

## 2016-09-06 DIAGNOSIS — D509 Iron deficiency anemia, unspecified: Secondary | ICD-10-CM | POA: Diagnosis not present

## 2016-09-09 DIAGNOSIS — D631 Anemia in chronic kidney disease: Secondary | ICD-10-CM | POA: Diagnosis not present

## 2016-09-09 DIAGNOSIS — N186 End stage renal disease: Secondary | ICD-10-CM | POA: Diagnosis not present

## 2016-09-09 DIAGNOSIS — D509 Iron deficiency anemia, unspecified: Secondary | ICD-10-CM | POA: Diagnosis not present

## 2016-09-09 DIAGNOSIS — N2581 Secondary hyperparathyroidism of renal origin: Secondary | ICD-10-CM | POA: Diagnosis not present

## 2016-09-11 DIAGNOSIS — D509 Iron deficiency anemia, unspecified: Secondary | ICD-10-CM | POA: Diagnosis not present

## 2016-09-11 DIAGNOSIS — N2581 Secondary hyperparathyroidism of renal origin: Secondary | ICD-10-CM | POA: Diagnosis not present

## 2016-09-11 DIAGNOSIS — D631 Anemia in chronic kidney disease: Secondary | ICD-10-CM | POA: Diagnosis not present

## 2016-09-11 DIAGNOSIS — N186 End stage renal disease: Secondary | ICD-10-CM | POA: Diagnosis not present

## 2016-09-13 DIAGNOSIS — D509 Iron deficiency anemia, unspecified: Secondary | ICD-10-CM | POA: Diagnosis not present

## 2016-09-13 DIAGNOSIS — N186 End stage renal disease: Secondary | ICD-10-CM | POA: Diagnosis not present

## 2016-09-13 DIAGNOSIS — D631 Anemia in chronic kidney disease: Secondary | ICD-10-CM | POA: Diagnosis not present

## 2016-09-13 DIAGNOSIS — N2581 Secondary hyperparathyroidism of renal origin: Secondary | ICD-10-CM | POA: Diagnosis not present

## 2016-09-15 DIAGNOSIS — N186 End stage renal disease: Secondary | ICD-10-CM | POA: Diagnosis not present

## 2016-09-15 DIAGNOSIS — D631 Anemia in chronic kidney disease: Secondary | ICD-10-CM | POA: Diagnosis not present

## 2016-09-15 DIAGNOSIS — D509 Iron deficiency anemia, unspecified: Secondary | ICD-10-CM | POA: Diagnosis not present

## 2016-09-15 DIAGNOSIS — N2581 Secondary hyperparathyroidism of renal origin: Secondary | ICD-10-CM | POA: Diagnosis not present

## 2016-09-17 DIAGNOSIS — N186 End stage renal disease: Secondary | ICD-10-CM | POA: Diagnosis not present

## 2016-09-17 DIAGNOSIS — N2581 Secondary hyperparathyroidism of renal origin: Secondary | ICD-10-CM | POA: Diagnosis not present

## 2016-09-17 DIAGNOSIS — D631 Anemia in chronic kidney disease: Secondary | ICD-10-CM | POA: Diagnosis not present

## 2016-09-17 DIAGNOSIS — D509 Iron deficiency anemia, unspecified: Secondary | ICD-10-CM | POA: Diagnosis not present

## 2016-09-20 DIAGNOSIS — N186 End stage renal disease: Secondary | ICD-10-CM | POA: Diagnosis not present

## 2016-09-20 DIAGNOSIS — D509 Iron deficiency anemia, unspecified: Secondary | ICD-10-CM | POA: Diagnosis not present

## 2016-09-20 DIAGNOSIS — N2581 Secondary hyperparathyroidism of renal origin: Secondary | ICD-10-CM | POA: Diagnosis not present

## 2016-09-20 DIAGNOSIS — D631 Anemia in chronic kidney disease: Secondary | ICD-10-CM | POA: Diagnosis not present

## 2016-09-23 DIAGNOSIS — D509 Iron deficiency anemia, unspecified: Secondary | ICD-10-CM | POA: Diagnosis not present

## 2016-09-23 DIAGNOSIS — N2581 Secondary hyperparathyroidism of renal origin: Secondary | ICD-10-CM | POA: Diagnosis not present

## 2016-09-23 DIAGNOSIS — D631 Anemia in chronic kidney disease: Secondary | ICD-10-CM | POA: Diagnosis not present

## 2016-09-23 DIAGNOSIS — N186 End stage renal disease: Secondary | ICD-10-CM | POA: Diagnosis not present

## 2016-09-25 DIAGNOSIS — Z992 Dependence on renal dialysis: Secondary | ICD-10-CM | POA: Diagnosis not present

## 2016-09-25 DIAGNOSIS — D631 Anemia in chronic kidney disease: Secondary | ICD-10-CM | POA: Diagnosis not present

## 2016-09-25 DIAGNOSIS — N186 End stage renal disease: Secondary | ICD-10-CM | POA: Diagnosis not present

## 2016-09-25 DIAGNOSIS — D509 Iron deficiency anemia, unspecified: Secondary | ICD-10-CM | POA: Diagnosis not present

## 2016-09-25 DIAGNOSIS — N2581 Secondary hyperparathyroidism of renal origin: Secondary | ICD-10-CM | POA: Diagnosis not present

## 2016-09-27 DIAGNOSIS — D509 Iron deficiency anemia, unspecified: Secondary | ICD-10-CM | POA: Diagnosis not present

## 2016-09-27 DIAGNOSIS — N186 End stage renal disease: Secondary | ICD-10-CM | POA: Diagnosis not present

## 2016-09-27 DIAGNOSIS — D631 Anemia in chronic kidney disease: Secondary | ICD-10-CM | POA: Diagnosis not present

## 2016-09-30 DIAGNOSIS — D631 Anemia in chronic kidney disease: Secondary | ICD-10-CM | POA: Diagnosis not present

## 2016-09-30 DIAGNOSIS — D509 Iron deficiency anemia, unspecified: Secondary | ICD-10-CM | POA: Diagnosis not present

## 2016-09-30 DIAGNOSIS — N186 End stage renal disease: Secondary | ICD-10-CM | POA: Diagnosis not present

## 2016-10-02 DIAGNOSIS — D509 Iron deficiency anemia, unspecified: Secondary | ICD-10-CM | POA: Diagnosis not present

## 2016-10-02 DIAGNOSIS — N186 End stage renal disease: Secondary | ICD-10-CM | POA: Diagnosis not present

## 2016-10-02 DIAGNOSIS — D631 Anemia in chronic kidney disease: Secondary | ICD-10-CM | POA: Diagnosis not present

## 2016-10-04 DIAGNOSIS — D631 Anemia in chronic kidney disease: Secondary | ICD-10-CM | POA: Diagnosis not present

## 2016-10-04 DIAGNOSIS — D509 Iron deficiency anemia, unspecified: Secondary | ICD-10-CM | POA: Diagnosis not present

## 2016-10-04 DIAGNOSIS — N186 End stage renal disease: Secondary | ICD-10-CM | POA: Diagnosis not present

## 2016-10-07 DIAGNOSIS — N186 End stage renal disease: Secondary | ICD-10-CM | POA: Diagnosis not present

## 2016-10-07 DIAGNOSIS — D509 Iron deficiency anemia, unspecified: Secondary | ICD-10-CM | POA: Diagnosis not present

## 2016-10-07 DIAGNOSIS — N400 Enlarged prostate without lower urinary tract symptoms: Secondary | ICD-10-CM | POA: Diagnosis not present

## 2016-10-07 DIAGNOSIS — Z992 Dependence on renal dialysis: Secondary | ICD-10-CM | POA: Diagnosis not present

## 2016-10-07 DIAGNOSIS — D631 Anemia in chronic kidney disease: Secondary | ICD-10-CM | POA: Diagnosis not present

## 2016-10-09 DIAGNOSIS — N186 End stage renal disease: Secondary | ICD-10-CM | POA: Diagnosis not present

## 2016-10-09 DIAGNOSIS — D631 Anemia in chronic kidney disease: Secondary | ICD-10-CM | POA: Diagnosis not present

## 2016-10-09 DIAGNOSIS — D509 Iron deficiency anemia, unspecified: Secondary | ICD-10-CM | POA: Diagnosis not present

## 2016-10-10 ENCOUNTER — Encounter: Payer: BLUE CROSS/BLUE SHIELD | Admitting: Internal Medicine

## 2016-10-11 DIAGNOSIS — D631 Anemia in chronic kidney disease: Secondary | ICD-10-CM | POA: Diagnosis not present

## 2016-10-11 DIAGNOSIS — D509 Iron deficiency anemia, unspecified: Secondary | ICD-10-CM | POA: Diagnosis not present

## 2016-10-11 DIAGNOSIS — N186 End stage renal disease: Secondary | ICD-10-CM | POA: Diagnosis not present

## 2016-10-14 DIAGNOSIS — D631 Anemia in chronic kidney disease: Secondary | ICD-10-CM | POA: Diagnosis not present

## 2016-10-14 DIAGNOSIS — N186 End stage renal disease: Secondary | ICD-10-CM | POA: Diagnosis not present

## 2016-10-14 DIAGNOSIS — D509 Iron deficiency anemia, unspecified: Secondary | ICD-10-CM | POA: Diagnosis not present

## 2016-10-16 DIAGNOSIS — D509 Iron deficiency anemia, unspecified: Secondary | ICD-10-CM | POA: Diagnosis not present

## 2016-10-16 DIAGNOSIS — N186 End stage renal disease: Secondary | ICD-10-CM | POA: Diagnosis not present

## 2016-10-16 DIAGNOSIS — D631 Anemia in chronic kidney disease: Secondary | ICD-10-CM | POA: Diagnosis not present

## 2016-10-18 DIAGNOSIS — N186 End stage renal disease: Secondary | ICD-10-CM | POA: Diagnosis not present

## 2016-10-18 DIAGNOSIS — D509 Iron deficiency anemia, unspecified: Secondary | ICD-10-CM | POA: Diagnosis not present

## 2016-10-18 DIAGNOSIS — D631 Anemia in chronic kidney disease: Secondary | ICD-10-CM | POA: Diagnosis not present

## 2016-10-21 DIAGNOSIS — D509 Iron deficiency anemia, unspecified: Secondary | ICD-10-CM | POA: Diagnosis not present

## 2016-10-21 DIAGNOSIS — N186 End stage renal disease: Secondary | ICD-10-CM | POA: Diagnosis not present

## 2016-10-21 DIAGNOSIS — D631 Anemia in chronic kidney disease: Secondary | ICD-10-CM | POA: Diagnosis not present

## 2016-10-23 DIAGNOSIS — D509 Iron deficiency anemia, unspecified: Secondary | ICD-10-CM | POA: Diagnosis not present

## 2016-10-23 DIAGNOSIS — N186 End stage renal disease: Secondary | ICD-10-CM | POA: Diagnosis not present

## 2016-10-23 DIAGNOSIS — D631 Anemia in chronic kidney disease: Secondary | ICD-10-CM | POA: Diagnosis not present

## 2016-10-25 DIAGNOSIS — D509 Iron deficiency anemia, unspecified: Secondary | ICD-10-CM | POA: Diagnosis not present

## 2016-10-25 DIAGNOSIS — D631 Anemia in chronic kidney disease: Secondary | ICD-10-CM | POA: Diagnosis not present

## 2016-10-25 DIAGNOSIS — N186 End stage renal disease: Secondary | ICD-10-CM | POA: Diagnosis not present

## 2016-10-26 DIAGNOSIS — N186 End stage renal disease: Secondary | ICD-10-CM | POA: Diagnosis not present

## 2016-10-26 DIAGNOSIS — Z992 Dependence on renal dialysis: Secondary | ICD-10-CM | POA: Diagnosis not present

## 2016-10-28 DIAGNOSIS — D631 Anemia in chronic kidney disease: Secondary | ICD-10-CM | POA: Diagnosis not present

## 2016-10-28 DIAGNOSIS — H52221 Regular astigmatism, right eye: Secondary | ICD-10-CM | POA: Diagnosis not present

## 2016-10-28 DIAGNOSIS — H31012 Macula scars of posterior pole (postinflammatory) (post-traumatic), left eye: Secondary | ICD-10-CM | POA: Diagnosis not present

## 2016-10-28 DIAGNOSIS — H5213 Myopia, bilateral: Secondary | ICD-10-CM | POA: Diagnosis not present

## 2016-10-28 DIAGNOSIS — D509 Iron deficiency anemia, unspecified: Secondary | ICD-10-CM | POA: Diagnosis not present

## 2016-10-28 DIAGNOSIS — H40003 Preglaucoma, unspecified, bilateral: Secondary | ICD-10-CM | POA: Diagnosis not present

## 2016-10-28 DIAGNOSIS — H524 Presbyopia: Secondary | ICD-10-CM | POA: Diagnosis not present

## 2016-10-28 DIAGNOSIS — N186 End stage renal disease: Secondary | ICD-10-CM | POA: Diagnosis not present

## 2016-10-28 DIAGNOSIS — N2581 Secondary hyperparathyroidism of renal origin: Secondary | ICD-10-CM | POA: Diagnosis not present

## 2016-10-30 DIAGNOSIS — D631 Anemia in chronic kidney disease: Secondary | ICD-10-CM | POA: Diagnosis not present

## 2016-10-30 DIAGNOSIS — N2581 Secondary hyperparathyroidism of renal origin: Secondary | ICD-10-CM | POA: Diagnosis not present

## 2016-10-30 DIAGNOSIS — D509 Iron deficiency anemia, unspecified: Secondary | ICD-10-CM | POA: Diagnosis not present

## 2016-10-30 DIAGNOSIS — N186 End stage renal disease: Secondary | ICD-10-CM | POA: Diagnosis not present

## 2016-11-01 DIAGNOSIS — D509 Iron deficiency anemia, unspecified: Secondary | ICD-10-CM | POA: Diagnosis not present

## 2016-11-01 DIAGNOSIS — N186 End stage renal disease: Secondary | ICD-10-CM | POA: Diagnosis not present

## 2016-11-01 DIAGNOSIS — N2581 Secondary hyperparathyroidism of renal origin: Secondary | ICD-10-CM | POA: Diagnosis not present

## 2016-11-01 DIAGNOSIS — D631 Anemia in chronic kidney disease: Secondary | ICD-10-CM | POA: Diagnosis not present

## 2016-11-04 DIAGNOSIS — N2581 Secondary hyperparathyroidism of renal origin: Secondary | ICD-10-CM | POA: Diagnosis not present

## 2016-11-04 DIAGNOSIS — N186 End stage renal disease: Secondary | ICD-10-CM | POA: Diagnosis not present

## 2016-11-04 DIAGNOSIS — D631 Anemia in chronic kidney disease: Secondary | ICD-10-CM | POA: Diagnosis not present

## 2016-11-04 DIAGNOSIS — D509 Iron deficiency anemia, unspecified: Secondary | ICD-10-CM | POA: Diagnosis not present

## 2016-11-05 ENCOUNTER — Telehealth: Payer: Self-pay | Admitting: *Deleted

## 2016-11-05 ENCOUNTER — Ambulatory Visit (INDEPENDENT_AMBULATORY_CARE_PROVIDER_SITE_OTHER): Payer: Medicare Other | Admitting: Internal Medicine

## 2016-11-05 ENCOUNTER — Other Ambulatory Visit (INDEPENDENT_AMBULATORY_CARE_PROVIDER_SITE_OTHER): Payer: Medicare Other

## 2016-11-05 ENCOUNTER — Encounter: Payer: Self-pay | Admitting: Internal Medicine

## 2016-11-05 VITALS — BP 122/88 | HR 77 | Ht 68.0 in | Wt 228.0 lb

## 2016-11-05 DIAGNOSIS — M329 Systemic lupus erythematosus, unspecified: Secondary | ICD-10-CM | POA: Diagnosis not present

## 2016-11-05 DIAGNOSIS — R809 Proteinuria, unspecified: Secondary | ICD-10-CM | POA: Diagnosis not present

## 2016-11-05 DIAGNOSIS — E785 Hyperlipidemia, unspecified: Secondary | ICD-10-CM

## 2016-11-05 DIAGNOSIS — N320 Bladder-neck obstruction: Secondary | ICD-10-CM | POA: Diagnosis not present

## 2016-11-05 DIAGNOSIS — N186 End stage renal disease: Secondary | ICD-10-CM | POA: Diagnosis not present

## 2016-11-05 DIAGNOSIS — Z23 Encounter for immunization: Secondary | ICD-10-CM

## 2016-11-05 DIAGNOSIS — Z7682 Awaiting organ transplant status: Secondary | ICD-10-CM | POA: Diagnosis not present

## 2016-11-05 DIAGNOSIS — Z0000 Encounter for general adult medical examination without abnormal findings: Secondary | ICD-10-CM

## 2016-11-05 DIAGNOSIS — Z992 Dependence on renal dialysis: Secondary | ICD-10-CM | POA: Diagnosis not present

## 2016-11-05 DIAGNOSIS — N185 Chronic kidney disease, stage 5: Secondary | ICD-10-CM

## 2016-11-05 LAB — LIPID PANEL
CHOL/HDL RATIO: 4
Cholesterol: 210 mg/dL — ABNORMAL HIGH (ref 0–200)
HDL: 49.3 mg/dL (ref >39.00)
LDL Cholesterol: 138 mg/dL — ABNORMAL HIGH (ref 0–99)
NONHDL: 160.74
TRIGLYCERIDES: 112 mg/dL (ref 0.0–149.0)
VLDL: 22.4 mg/dL (ref 0.0–40.0)

## 2016-11-05 LAB — CBC WITH DIFFERENTIAL/PLATELET
BASOS ABS: 0 10*3/uL (ref 0.0–0.1)
Basophils Relative: 0.6 % (ref 0.0–3.0)
EOS PCT: 3.2 % (ref 0.0–5.0)
Eosinophils Absolute: 0.2 10*3/uL (ref 0.0–0.7)
HCT: 33.1 % — ABNORMAL LOW (ref 39.0–52.0)
Hemoglobin: 11.3 g/dL — ABNORMAL LOW (ref 13.0–17.0)
Lymphocytes Relative: 17 % (ref 12.0–46.0)
Lymphs Abs: 1.2 10*3/uL (ref 0.7–4.0)
MCHC: 34 g/dL (ref 30.0–36.0)
MCV: 91.9 fl (ref 78.0–100.0)
MONO ABS: 0.8 10*3/uL (ref 0.1–1.0)
MONOS PCT: 12 % (ref 3.0–12.0)
Neutro Abs: 4.6 10*3/uL (ref 1.4–7.7)
Neutrophils Relative %: 67.2 % (ref 43.0–77.0)
Platelets: 232 10*3/uL (ref 150.0–400.0)
RBC: 3.61 Mil/uL — AB (ref 4.22–5.81)
RDW: 16 % — ABNORMAL HIGH (ref 11.5–15.5)
WBC: 6.8 10*3/uL (ref 4.0–10.5)

## 2016-11-05 LAB — HEPATIC FUNCTION PANEL
ALBUMIN: 4.5 g/dL (ref 3.5–5.2)
ALK PHOS: 66 U/L (ref 39–117)
ALT: 10 U/L (ref 0–53)
AST: 8 U/L (ref 0–37)
BILIRUBIN TOTAL: 1 mg/dL (ref 0.2–1.2)
Bilirubin, Direct: 0.1 mg/dL (ref 0.0–0.3)
Total Protein: 7.9 g/dL (ref 6.0–8.3)

## 2016-11-05 LAB — BASIC METABOLIC PANEL
BUN: 54 mg/dL — ABNORMAL HIGH (ref 6–23)
CALCIUM: 8 mg/dL — AB (ref 8.4–10.5)
CO2: 25 mEq/L (ref 19–32)
Chloride: 100 mEq/L (ref 96–112)
Creatinine, Ser: 13.81 mg/dL (ref 0.40–1.50)
GFR: 4.92 mL/min — AB (ref >60.00)
GLUCOSE: 83 mg/dL (ref 70–99)
POTASSIUM: 4.9 meq/L (ref 3.5–5.1)
SODIUM: 142 meq/L (ref 135–145)

## 2016-11-05 LAB — PSA: PSA: 0.45 ng/mL (ref 0.10–4.00)

## 2016-11-05 LAB — TSH: TSH: 2.04 u[IU]/mL (ref 0.35–4.50)

## 2016-11-05 NOTE — Patient Instructions (Signed)

## 2016-11-05 NOTE — Telephone Encounter (Signed)
He is on HD

## 2016-11-05 NOTE — Assessment & Plan Note (Signed)
Here for medicare wellness/physical  Diet: heart healthy  Physical activity: not sedentary  Depression/mood screen: negative  Hearing: intact to whispered voice  Visual acuity: grossly normal, performs annual eye exam  ADLs: capable  Fall risk: low to none  Home safety: good  Cognitive evaluation: intact to orientation, naming, recall and repetition  EOL planning: adv directives, full code/ I agree  I have personally reviewed and have noted  1. The patient's medical, surgical and social history  2. Their use of alcohol, tobacco or illicit drugs  3. Their current medications and supplements  4. The patient's functional ability including ADL's, fall risks, home safety risks and hearing or visual impairment.  5. Diet and physical activities  6. Evidence for depression or mood disorders 7. The roster of all physicians providing medical care to patient - is listed in the Snapshot section of the chart and reviewed today.    Today patient counseled on age appropriate routine health concerns for screening and prevention, each reviewed and up to date or declined. Immunizations reviewed and up to date or declined. Labs ordered and reviewed. Risk factors for depression reviewed and negative. Hearing function and visual acuity are intact. ADLs screened and addressed as needed. Functional ability and level of safety reviewed and appropriate. Education, counseling and referrals performed based on assessed risks today. Patient provided with a copy of personalized plan for preventive services.   On disability since 10/17 On HD since 12/2012

## 2016-11-05 NOTE — Progress Notes (Signed)
Pre visit review using our clinic review tool, if applicable. No additional management support is needed unless otherwise documented below in the visit note. 

## 2016-11-05 NOTE — Telephone Encounter (Signed)
[facility] CRITICAL CREATININE- 13.81                                                 CRITICAL GFR- 4.92

## 2016-11-05 NOTE — Progress Notes (Signed)
Subjective:  Patient ID: Tyler Franco, male    DOB: January 03, 1966  Age: 51 y.o. MRN: [id number]  CC: No chief complaint on file.   HPI Tyler Franco presents for a well exam. His ex-wife Jefferey Lippmann died in 2017/11/05they have a 50 yo dtr together. On HD since 2013  Outpatient Medications Prior to Visit  Medication Sig Dispense Refill  . labetalol (NORMODYNE) 200 MG tablet 3 tabs daily 90 tablet 11  . sevelamer carbonate (RENVELA) 800 MG tablet Take 3,200 mg by mouth.    . terazosin (HYTRIN) 1 MG capsule Take 1 mg by mouth daily.     No facility-administered medications prior to visit.     ROS Review of Systems  Constitutional: Negative for appetite change, fatigue and unexpected weight change.  HENT: Negative for congestion, nosebleeds, sneezing, sore throat and trouble swallowing.   Eyes: Negative for itching and visual disturbance.  Respiratory: Negative for cough.   Cardiovascular: Negative for chest pain, palpitations and leg swelling.  Gastrointestinal: Negative for abdominal distention, blood in stool, diarrhea and nausea.  Genitourinary: Negative for frequency and hematuria.  Musculoskeletal: Negative for back pain, gait problem, joint swelling and neck pain.  Skin: Negative for rash.  Neurological: Negative for dizziness, tremors, speech difficulty and weakness.  Psychiatric/Behavioral: Negative for agitation, dysphoric mood, sleep disturbance and suicidal ideas. The patient is not nervous/anxious.     Objective:  BP 122/88   Pulse 77   Ht 5\' 8"  (1.727 m)   Wt 228 lb (103.4 kg)   SpO2 95%   BMI 34.67 kg/m   BP Readings from Last 3 Encounters:  11/05/16 122/88  07/16/15 (!) 164/96  04/18/15 (!) 166/90    Wt Readings from Last 3 Encounters:  11/05/16 228 lb (103.4 kg)  07/16/15 236 lb (107 kg)  04/18/15 230 lb (104.3 kg)    Physical Exam  Constitutional: He is oriented to person, place, and time. He appears well-developed. No distress.  NAD    HENT:  Mouth/Throat: Oropharynx is clear and moist.  Eyes: Conjunctivae are normal. Pupils are equal, round, and reactive to light.  Neck: Normal range of motion. No JVD present. No thyromegaly present.  Cardiovascular: Normal rate, regular rhythm, normal heart sounds and intact distal pulses.  Exam reveals no gallop and no friction rub.   No murmur heard. Pulmonary/Chest: Effort normal and breath sounds normal. No respiratory distress. He has no wheezes. He has no rales. He exhibits no tenderness.  Abdominal: Soft. Bowel sounds are normal. He exhibits no distension and no mass. There is no tenderness. There is no rebound and no guarding.  Musculoskeletal: Normal range of motion. He exhibits no edema or tenderness.  Lymphadenopathy:    He has no cervical adenopathy.  Neurological: He is alert and oriented to person, place, and time. He has normal reflexes. No cranial nerve deficit. He exhibits normal muscle tone. He displays a negative Romberg sign. Coordination and gait normal.  Skin: Skin is warm and dry. No rash noted.  Psychiatric: He has a normal mood and affect. His behavior is normal. Judgment and thought content normal.   L forearm fistula  EKG - machine malfunction   Lab Results  Component Value Date   WBC 7.8 08/28/2014   HGB 11.3 (L) 08/28/2014   HCT 35.7 (L) 08/28/2014   PLT 197.0 08/28/2014   GLUCOSE 74 08/28/2014   CHOL 178 08/28/2014   TRIG 85.0 08/28/2014   HDL 40.40 08/28/2014  LDLDIRECT 122.3 04/28/2007   LDLCALC 121 (H) 08/28/2014   ALT 13 08/28/2014   AST 9 08/28/2014   NA 138 08/28/2014   K 6.2 (HH) 08/28/2014   CL 102 08/28/2014   CREATININE 14.4 (HH) 08/28/2014   BUN 60 (H) 08/28/2014   CO2 23 08/28/2014   TSH 2.37 08/28/2014   PSA 0.71 08/28/2014    Ct Abdomen Pelvis Wo Contrast  Result Date: 04/25/2011 *RADIOLOGY REPORT* Clinical Data: Hematuria CT ABDOMEN AND PELVIS WITHOUT CONTRAST Technique:  Multidetector CT imaging of the abdomen and  pelvis was performed following the standard protocol without intravenous contrast. Comparison: 07/14/2008 Findings: The lung bases are clear.  Small esophageal hiatal hernia.  Mild central atrophy and both kidneys.  No pyelocaliectasis or ureterectasis on either kidney.  No renal or ureteral stones are demonstrated.  The bladder wall is not thickened.  No bladder stones are seen.  Bilateral lower pole calculi seen previously are no longer apparent today. The unenhanced liver, spleen, gallbladder, pancreas, adrenal glands, decompressed stomach, decompressed small bowel, abdominal aorta, and retroperitoneal lymph nodes are unremarkable.  No free fluid or free air in the abdomen. Pelvis:  No free or loculated pelvic fluid collections.  Scattered diverticula in the sigmoid colon without inflammatory change.  The appendix is normal.  Fat in the inguinal canals.  Degenerative changes in the lumbar spine with normal alignment. IMPRESSION: Bilateral central renal atrophy.  No renal or ureteral stone or obstruction demonstrated.  No bladder wall thickening or bladder stone. Original Report Authenticated By: Marlon PelWILLIAM R. STEVENS, M.D.   Assessment & Plan:   There are no diagnoses linked to this encounter. I am having Tyler Franco maintain his sevelamer carbonate, terazosin, labetalol, and amLODipine.  Meds ordered this encounter  Medications  . amLODipine (NORVASC) 5 MG tablet    Sig: Take 1 tablet by mouth daily.     Follow-up: No Follow-up on file.  Sonda PrimesAlex Janasia Coverdale, MD

## 2016-11-05 NOTE — Assessment & Plan Note (Addendum)
  [facility] On disability since 10/17 On HD since 12/2012

## 2016-11-06 DIAGNOSIS — D631 Anemia in chronic kidney disease: Secondary | ICD-10-CM | POA: Diagnosis not present

## 2016-11-06 DIAGNOSIS — D509 Iron deficiency anemia, unspecified: Secondary | ICD-10-CM | POA: Diagnosis not present

## 2016-11-06 DIAGNOSIS — N186 End stage renal disease: Secondary | ICD-10-CM | POA: Diagnosis not present

## 2016-11-06 DIAGNOSIS — N2581 Secondary hyperparathyroidism of renal origin: Secondary | ICD-10-CM | POA: Diagnosis not present

## 2016-11-07 ENCOUNTER — Telehealth: Payer: Self-pay | Admitting: Internal Medicine

## 2016-11-07 NOTE — Telephone Encounter (Signed)
Inform pt of test result, he said he is also on dialysis.

## 2016-11-08 DIAGNOSIS — N2581 Secondary hyperparathyroidism of renal origin: Secondary | ICD-10-CM | POA: Diagnosis not present

## 2016-11-08 DIAGNOSIS — D631 Anemia in chronic kidney disease: Secondary | ICD-10-CM | POA: Diagnosis not present

## 2016-11-08 DIAGNOSIS — D509 Iron deficiency anemia, unspecified: Secondary | ICD-10-CM | POA: Diagnosis not present

## 2016-11-08 DIAGNOSIS — N186 End stage renal disease: Secondary | ICD-10-CM | POA: Diagnosis not present

## 2016-11-11 DIAGNOSIS — D631 Anemia in chronic kidney disease: Secondary | ICD-10-CM | POA: Diagnosis not present

## 2016-11-11 DIAGNOSIS — D509 Iron deficiency anemia, unspecified: Secondary | ICD-10-CM | POA: Diagnosis not present

## 2016-11-11 DIAGNOSIS — N186 End stage renal disease: Secondary | ICD-10-CM | POA: Diagnosis not present

## 2016-11-11 DIAGNOSIS — N2581 Secondary hyperparathyroidism of renal origin: Secondary | ICD-10-CM | POA: Diagnosis not present

## 2016-11-11 NOTE — Telephone Encounter (Signed)
Copies mailed to pt on 11/11/16 per PCP. See labs.

## 2016-11-13 DIAGNOSIS — D631 Anemia in chronic kidney disease: Secondary | ICD-10-CM | POA: Diagnosis not present

## 2016-11-13 DIAGNOSIS — N2581 Secondary hyperparathyroidism of renal origin: Secondary | ICD-10-CM | POA: Diagnosis not present

## 2016-11-13 DIAGNOSIS — N186 End stage renal disease: Secondary | ICD-10-CM | POA: Diagnosis not present

## 2016-11-13 DIAGNOSIS — D509 Iron deficiency anemia, unspecified: Secondary | ICD-10-CM | POA: Diagnosis not present

## 2016-11-14 ENCOUNTER — Ambulatory Visit: Payer: Medicare Other

## 2016-11-15 DIAGNOSIS — D631 Anemia in chronic kidney disease: Secondary | ICD-10-CM | POA: Diagnosis not present

## 2016-11-15 DIAGNOSIS — N2581 Secondary hyperparathyroidism of renal origin: Secondary | ICD-10-CM | POA: Diagnosis not present

## 2016-11-15 DIAGNOSIS — D509 Iron deficiency anemia, unspecified: Secondary | ICD-10-CM | POA: Diagnosis not present

## 2016-11-15 DIAGNOSIS — N186 End stage renal disease: Secondary | ICD-10-CM | POA: Diagnosis not present

## 2016-11-17 ENCOUNTER — Ambulatory Visit (INDEPENDENT_AMBULATORY_CARE_PROVIDER_SITE_OTHER): Payer: Medicare Other | Admitting: Internal Medicine

## 2016-11-17 DIAGNOSIS — Z0000 Encounter for general adult medical examination without abnormal findings: Secondary | ICD-10-CM | POA: Diagnosis not present

## 2016-11-17 NOTE — Progress Notes (Signed)
   Subjective:  Patient ID: Tyler Franco, male    DOB: 03/30/1966  Age: 51 y.o. MRN: [id number]  CC: No chief complaint on file.   HPI Tyler Franco presents for an EKG  Outpatient Medications Prior to Visit  Medication Sig Dispense Refill  . amLODipine (NORVASC) 5 MG tablet Take 1 tablet by mouth daily.    [name]. labetalol (NORMODYNE) 200 MG tablet 3 tabs daily 90 tablet 11  . sevelamer carbonate (RENVELA) 800 MG tablet Take 3,200 mg by mouth.    . terazosin (HYTRIN) 1 MG capsule Take 1 mg by mouth daily.     No facility-administered medications prior to visit.     ROS Review of Systems  Objective:  There were no vitals taken for this visit.  BP Readings from Last 3 Encounters:  11/05/16 122/88  07/16/15 (!) 164/96  04/18/15 (!) 166/90    Wt Readings from Last 3 Encounters:  11/05/16 228 lb (103.4 kg)  07/16/15 236 lb (107 kg)  04/18/15 230 lb (104.3 kg)    Physical Exam   Procedure: EKG Indication: well exam, ESRD Impression: NSR. No new changes.  Lab Results  Component Value Date   WBC 6.8 11/05/2016   HGB 11.3 (L) 11/05/2016   HCT 33.1 (L) 11/05/2016   PLT 232.0 11/05/2016   GLUCOSE 83 11/05/2016   CHOL 210 (H) 11/05/2016   TRIG 112.0 11/05/2016   HDL 49.30 11/05/2016   LDLDIRECT 122.3 04/28/2007   LDLCALC 138 (H) 11/05/2016   ALT 10 11/05/2016   AST 8 11/05/2016   NA 142 11/05/2016   K 4.9 11/05/2016   CL 100 11/05/2016   CREATININE 13.81 (HH) 11/05/2016   BUN 54 (H) 11/05/2016   CO2 25 11/05/2016   TSH 2.04 11/05/2016   PSA 0.45 11/05/2016    Ct Abdomen Pelvis Wo Contrast  Result Date: 04/25/2011 *RADIOLOGY REPORT* Clinical Data: Hematuria CT ABDOMEN AND PELVIS WITHOUT CONTRAST Technique:  Multidetector CT imaging of the abdomen and pelvis was performed following the standard protocol without intravenous contrast. Comparison: 07/14/2008 Findings: The lung bases are clear.  Small esophageal hiatal hernia.  Mild central atrophy and both  kidneys.  No pyelocaliectasis or ureterectasis on either kidney.  No renal or ureteral stones are demonstrated.  The bladder wall is not thickened.  No bladder stones are seen.  Bilateral lower pole calculi seen previously are no longer apparent today. The unenhanced liver, spleen, gallbladder, pancreas, adrenal glands, decompressed stomach, decompressed small bowel, abdominal aorta, and retroperitoneal lymph nodes are unremarkable.  No free fluid or free air in the abdomen. Pelvis:  No free or loculated pelvic fluid collections.  Scattered diverticula in the sigmoid colon without inflammatory change.  The appendix is normal.  Fat in the inguinal canals.  Degenerative changes in the lumbar spine with normal alignment. IMPRESSION: Bilateral central renal atrophy.  No renal or ureteral stone or obstruction demonstrated.  No bladder wall thickening or bladder stone. Original Report Authenticated By: Marlon PelWILLIAM R. STEVENS, M.D.   Assessment & Plan:   Diagnoses and all orders for this visit:  Routine general medical examination at a health care facility -     EKG 12-Lead   I am having Mr. Alvester MorinBell maintain his sevelamer carbonate, terazosin, labetalol, and amLODipine.  No orders of the defined types were placed in this encounter.    Follow-up: No Follow-up on file.  Sonda PrimesAlex Plotnikov, MD

## 2016-11-18 DIAGNOSIS — D509 Iron deficiency anemia, unspecified: Secondary | ICD-10-CM | POA: Diagnosis not present

## 2016-11-18 DIAGNOSIS — D631 Anemia in chronic kidney disease: Secondary | ICD-10-CM | POA: Diagnosis not present

## 2016-11-18 DIAGNOSIS — N2581 Secondary hyperparathyroidism of renal origin: Secondary | ICD-10-CM | POA: Diagnosis not present

## 2016-11-18 DIAGNOSIS — N186 End stage renal disease: Secondary | ICD-10-CM | POA: Diagnosis not present

## 2016-11-20 DIAGNOSIS — N186 End stage renal disease: Secondary | ICD-10-CM | POA: Diagnosis not present

## 2016-11-20 DIAGNOSIS — N2581 Secondary hyperparathyroidism of renal origin: Secondary | ICD-10-CM | POA: Diagnosis not present

## 2016-11-20 DIAGNOSIS — D509 Iron deficiency anemia, unspecified: Secondary | ICD-10-CM | POA: Diagnosis not present

## 2016-11-20 DIAGNOSIS — D631 Anemia in chronic kidney disease: Secondary | ICD-10-CM | POA: Diagnosis not present

## 2016-11-22 DIAGNOSIS — N186 End stage renal disease: Secondary | ICD-10-CM | POA: Diagnosis not present

## 2016-11-22 DIAGNOSIS — N2581 Secondary hyperparathyroidism of renal origin: Secondary | ICD-10-CM | POA: Diagnosis not present

## 2016-11-22 DIAGNOSIS — D631 Anemia in chronic kidney disease: Secondary | ICD-10-CM | POA: Diagnosis not present

## 2016-11-22 DIAGNOSIS — D509 Iron deficiency anemia, unspecified: Secondary | ICD-10-CM | POA: Diagnosis not present

## 2016-11-25 DIAGNOSIS — N186 End stage renal disease: Secondary | ICD-10-CM | POA: Diagnosis not present

## 2016-11-25 DIAGNOSIS — D631 Anemia in chronic kidney disease: Secondary | ICD-10-CM | POA: Diagnosis not present

## 2016-11-25 DIAGNOSIS — D509 Iron deficiency anemia, unspecified: Secondary | ICD-10-CM | POA: Diagnosis not present

## 2016-11-25 DIAGNOSIS — N2581 Secondary hyperparathyroidism of renal origin: Secondary | ICD-10-CM | POA: Diagnosis not present

## 2016-11-26 DIAGNOSIS — Z992 Dependence on renal dialysis: Secondary | ICD-10-CM | POA: Diagnosis not present

## 2016-11-26 DIAGNOSIS — N186 End stage renal disease: Secondary | ICD-10-CM | POA: Diagnosis not present

## 2016-11-27 DIAGNOSIS — D509 Iron deficiency anemia, unspecified: Secondary | ICD-10-CM | POA: Diagnosis not present

## 2016-11-27 DIAGNOSIS — E8779 Other fluid overload: Secondary | ICD-10-CM | POA: Diagnosis not present

## 2016-11-27 DIAGNOSIS — N186 End stage renal disease: Secondary | ICD-10-CM | POA: Diagnosis not present

## 2016-11-27 DIAGNOSIS — D631 Anemia in chronic kidney disease: Secondary | ICD-10-CM | POA: Diagnosis not present

## 2016-11-29 DIAGNOSIS — D631 Anemia in chronic kidney disease: Secondary | ICD-10-CM | POA: Diagnosis not present

## 2016-11-29 DIAGNOSIS — N186 End stage renal disease: Secondary | ICD-10-CM | POA: Diagnosis not present

## 2016-11-29 DIAGNOSIS — D509 Iron deficiency anemia, unspecified: Secondary | ICD-10-CM | POA: Diagnosis not present

## 2016-11-29 DIAGNOSIS — E8779 Other fluid overload: Secondary | ICD-10-CM | POA: Diagnosis not present

## 2016-12-02 DIAGNOSIS — D509 Iron deficiency anemia, unspecified: Secondary | ICD-10-CM | POA: Diagnosis not present

## 2016-12-02 DIAGNOSIS — E8779 Other fluid overload: Secondary | ICD-10-CM | POA: Diagnosis not present

## 2016-12-02 DIAGNOSIS — D631 Anemia in chronic kidney disease: Secondary | ICD-10-CM | POA: Diagnosis not present

## 2016-12-02 DIAGNOSIS — N186 End stage renal disease: Secondary | ICD-10-CM | POA: Diagnosis not present

## 2016-12-03 DIAGNOSIS — D509 Iron deficiency anemia, unspecified: Secondary | ICD-10-CM | POA: Diagnosis not present

## 2016-12-03 DIAGNOSIS — N186 End stage renal disease: Secondary | ICD-10-CM | POA: Diagnosis not present

## 2016-12-03 DIAGNOSIS — E8779 Other fluid overload: Secondary | ICD-10-CM | POA: Diagnosis not present

## 2016-12-03 DIAGNOSIS — D631 Anemia in chronic kidney disease: Secondary | ICD-10-CM | POA: Diagnosis not present

## 2016-12-06 DIAGNOSIS — E8779 Other fluid overload: Secondary | ICD-10-CM | POA: Diagnosis not present

## 2016-12-06 DIAGNOSIS — D509 Iron deficiency anemia, unspecified: Secondary | ICD-10-CM | POA: Diagnosis not present

## 2016-12-06 DIAGNOSIS — D631 Anemia in chronic kidney disease: Secondary | ICD-10-CM | POA: Diagnosis not present

## 2016-12-06 DIAGNOSIS — N186 End stage renal disease: Secondary | ICD-10-CM | POA: Diagnosis not present

## 2016-12-09 DIAGNOSIS — D631 Anemia in chronic kidney disease: Secondary | ICD-10-CM | POA: Diagnosis not present

## 2016-12-09 DIAGNOSIS — N186 End stage renal disease: Secondary | ICD-10-CM | POA: Diagnosis not present

## 2016-12-09 DIAGNOSIS — D509 Iron deficiency anemia, unspecified: Secondary | ICD-10-CM | POA: Diagnosis not present

## 2016-12-09 DIAGNOSIS — E8779 Other fluid overload: Secondary | ICD-10-CM | POA: Diagnosis not present

## 2016-12-11 DIAGNOSIS — E8779 Other fluid overload: Secondary | ICD-10-CM | POA: Diagnosis not present

## 2016-12-11 DIAGNOSIS — D509 Iron deficiency anemia, unspecified: Secondary | ICD-10-CM | POA: Diagnosis not present

## 2016-12-11 DIAGNOSIS — N186 End stage renal disease: Secondary | ICD-10-CM | POA: Diagnosis not present

## 2016-12-11 DIAGNOSIS — D631 Anemia in chronic kidney disease: Secondary | ICD-10-CM | POA: Diagnosis not present

## 2016-12-13 DIAGNOSIS — D631 Anemia in chronic kidney disease: Secondary | ICD-10-CM | POA: Diagnosis not present

## 2016-12-13 DIAGNOSIS — N186 End stage renal disease: Secondary | ICD-10-CM | POA: Diagnosis not present

## 2016-12-13 DIAGNOSIS — E8779 Other fluid overload: Secondary | ICD-10-CM | POA: Diagnosis not present

## 2016-12-13 DIAGNOSIS — D509 Iron deficiency anemia, unspecified: Secondary | ICD-10-CM | POA: Diagnosis not present

## 2016-12-16 DIAGNOSIS — D631 Anemia in chronic kidney disease: Secondary | ICD-10-CM | POA: Diagnosis not present

## 2016-12-16 DIAGNOSIS — E8779 Other fluid overload: Secondary | ICD-10-CM | POA: Diagnosis not present

## 2016-12-16 DIAGNOSIS — N186 End stage renal disease: Secondary | ICD-10-CM | POA: Diagnosis not present

## 2016-12-16 DIAGNOSIS — D509 Iron deficiency anemia, unspecified: Secondary | ICD-10-CM | POA: Diagnosis not present

## 2016-12-18 DIAGNOSIS — N186 End stage renal disease: Secondary | ICD-10-CM | POA: Diagnosis not present

## 2016-12-18 DIAGNOSIS — E8779 Other fluid overload: Secondary | ICD-10-CM | POA: Diagnosis not present

## 2016-12-18 DIAGNOSIS — D509 Iron deficiency anemia, unspecified: Secondary | ICD-10-CM | POA: Diagnosis not present

## 2016-12-18 DIAGNOSIS — D631 Anemia in chronic kidney disease: Secondary | ICD-10-CM | POA: Diagnosis not present

## 2016-12-20 DIAGNOSIS — D631 Anemia in chronic kidney disease: Secondary | ICD-10-CM | POA: Diagnosis not present

## 2016-12-20 DIAGNOSIS — N186 End stage renal disease: Secondary | ICD-10-CM | POA: Diagnosis not present

## 2016-12-20 DIAGNOSIS — D509 Iron deficiency anemia, unspecified: Secondary | ICD-10-CM | POA: Diagnosis not present

## 2016-12-20 DIAGNOSIS — E8779 Other fluid overload: Secondary | ICD-10-CM | POA: Diagnosis not present

## 2016-12-23 DIAGNOSIS — N186 End stage renal disease: Secondary | ICD-10-CM | POA: Diagnosis not present

## 2016-12-23 DIAGNOSIS — D509 Iron deficiency anemia, unspecified: Secondary | ICD-10-CM | POA: Diagnosis not present

## 2016-12-23 DIAGNOSIS — E8779 Other fluid overload: Secondary | ICD-10-CM | POA: Diagnosis not present

## 2016-12-23 DIAGNOSIS — D631 Anemia in chronic kidney disease: Secondary | ICD-10-CM | POA: Diagnosis not present

## 2016-12-24 DIAGNOSIS — Z992 Dependence on renal dialysis: Secondary | ICD-10-CM | POA: Diagnosis not present

## 2016-12-24 DIAGNOSIS — N186 End stage renal disease: Secondary | ICD-10-CM | POA: Diagnosis not present

## 2016-12-25 DIAGNOSIS — N186 End stage renal disease: Secondary | ICD-10-CM | POA: Diagnosis not present

## 2016-12-25 DIAGNOSIS — D509 Iron deficiency anemia, unspecified: Secondary | ICD-10-CM | POA: Diagnosis not present

## 2016-12-27 DIAGNOSIS — D509 Iron deficiency anemia, unspecified: Secondary | ICD-10-CM | POA: Diagnosis not present

## 2016-12-27 DIAGNOSIS — N186 End stage renal disease: Secondary | ICD-10-CM | POA: Diagnosis not present

## 2016-12-30 DIAGNOSIS — N186 End stage renal disease: Secondary | ICD-10-CM | POA: Diagnosis not present

## 2016-12-30 DIAGNOSIS — D509 Iron deficiency anemia, unspecified: Secondary | ICD-10-CM | POA: Diagnosis not present

## 2016-12-31 DIAGNOSIS — N186 End stage renal disease: Secondary | ICD-10-CM | POA: Diagnosis not present

## 2016-12-31 DIAGNOSIS — E669 Obesity, unspecified: Secondary | ICD-10-CM | POA: Diagnosis not present

## 2016-12-31 DIAGNOSIS — D631 Anemia in chronic kidney disease: Secondary | ICD-10-CM | POA: Diagnosis not present

## 2016-12-31 DIAGNOSIS — Z9989 Dependence on other enabling machines and devices: Secondary | ICD-10-CM | POA: Diagnosis not present

## 2016-12-31 DIAGNOSIS — Z992 Dependence on renal dialysis: Secondary | ICD-10-CM | POA: Diagnosis not present

## 2016-12-31 DIAGNOSIS — E785 Hyperlipidemia, unspecified: Secondary | ICD-10-CM | POA: Diagnosis not present

## 2016-12-31 DIAGNOSIS — Z87442 Personal history of urinary calculi: Secondary | ICD-10-CM | POA: Diagnosis not present

## 2016-12-31 DIAGNOSIS — I120 Hypertensive chronic kidney disease with stage 5 chronic kidney disease or end stage renal disease: Secondary | ICD-10-CM | POA: Diagnosis not present

## 2016-12-31 DIAGNOSIS — M329 Systemic lupus erythematosus, unspecified: Secondary | ICD-10-CM | POA: Diagnosis not present

## 2016-12-31 DIAGNOSIS — Z79899 Other long term (current) drug therapy: Secondary | ICD-10-CM | POA: Diagnosis not present

## 2016-12-31 DIAGNOSIS — G4733 Obstructive sleep apnea (adult) (pediatric): Secondary | ICD-10-CM | POA: Diagnosis not present

## 2016-12-31 DIAGNOSIS — Z6836 Body mass index (BMI) 36.0-36.9, adult: Secondary | ICD-10-CM | POA: Diagnosis not present

## 2016-12-31 DIAGNOSIS — I151 Hypertension secondary to other renal disorders: Secondary | ICD-10-CM | POA: Diagnosis not present

## 2017-01-01 DIAGNOSIS — D509 Iron deficiency anemia, unspecified: Secondary | ICD-10-CM | POA: Diagnosis not present

## 2017-01-01 DIAGNOSIS — N186 End stage renal disease: Secondary | ICD-10-CM | POA: Diagnosis not present

## 2017-01-03 DIAGNOSIS — D509 Iron deficiency anemia, unspecified: Secondary | ICD-10-CM | POA: Diagnosis not present

## 2017-01-03 DIAGNOSIS — N186 End stage renal disease: Secondary | ICD-10-CM | POA: Diagnosis not present

## 2017-01-06 DIAGNOSIS — D509 Iron deficiency anemia, unspecified: Secondary | ICD-10-CM | POA: Diagnosis not present

## 2017-01-06 DIAGNOSIS — N186 End stage renal disease: Secondary | ICD-10-CM | POA: Diagnosis not present

## 2017-01-08 DIAGNOSIS — D509 Iron deficiency anemia, unspecified: Secondary | ICD-10-CM | POA: Diagnosis not present

## 2017-01-08 DIAGNOSIS — N186 End stage renal disease: Secondary | ICD-10-CM | POA: Diagnosis not present

## 2017-01-10 DIAGNOSIS — D509 Iron deficiency anemia, unspecified: Secondary | ICD-10-CM | POA: Diagnosis not present

## 2017-01-10 DIAGNOSIS — N186 End stage renal disease: Secondary | ICD-10-CM | POA: Diagnosis not present

## 2017-01-13 DIAGNOSIS — D509 Iron deficiency anemia, unspecified: Secondary | ICD-10-CM | POA: Diagnosis not present

## 2017-01-13 DIAGNOSIS — N186 End stage renal disease: Secondary | ICD-10-CM | POA: Diagnosis not present

## 2017-01-15 DIAGNOSIS — D509 Iron deficiency anemia, unspecified: Secondary | ICD-10-CM | POA: Diagnosis not present

## 2017-01-15 DIAGNOSIS — N186 End stage renal disease: Secondary | ICD-10-CM | POA: Diagnosis not present

## 2017-01-17 DIAGNOSIS — D509 Iron deficiency anemia, unspecified: Secondary | ICD-10-CM | POA: Diagnosis not present

## 2017-01-17 DIAGNOSIS — N186 End stage renal disease: Secondary | ICD-10-CM | POA: Diagnosis not present

## 2017-01-20 DIAGNOSIS — D509 Iron deficiency anemia, unspecified: Secondary | ICD-10-CM | POA: Diagnosis not present

## 2017-01-20 DIAGNOSIS — N186 End stage renal disease: Secondary | ICD-10-CM | POA: Diagnosis not present

## 2017-01-22 DIAGNOSIS — D509 Iron deficiency anemia, unspecified: Secondary | ICD-10-CM | POA: Diagnosis not present

## 2017-01-22 DIAGNOSIS — N186 End stage renal disease: Secondary | ICD-10-CM | POA: Diagnosis not present

## 2017-01-24 DIAGNOSIS — Z992 Dependence on renal dialysis: Secondary | ICD-10-CM | POA: Diagnosis not present

## 2017-01-24 DIAGNOSIS — N186 End stage renal disease: Secondary | ICD-10-CM | POA: Diagnosis not present

## 2017-01-24 DIAGNOSIS — D509 Iron deficiency anemia, unspecified: Secondary | ICD-10-CM | POA: Diagnosis not present

## 2017-01-27 DIAGNOSIS — N2581 Secondary hyperparathyroidism of renal origin: Secondary | ICD-10-CM | POA: Diagnosis not present

## 2017-01-27 DIAGNOSIS — N186 End stage renal disease: Secondary | ICD-10-CM | POA: Diagnosis not present

## 2017-01-29 DIAGNOSIS — N2581 Secondary hyperparathyroidism of renal origin: Secondary | ICD-10-CM | POA: Diagnosis not present

## 2017-01-29 DIAGNOSIS — N186 End stage renal disease: Secondary | ICD-10-CM | POA: Diagnosis not present

## 2017-01-31 DIAGNOSIS — N2581 Secondary hyperparathyroidism of renal origin: Secondary | ICD-10-CM | POA: Diagnosis not present

## 2017-01-31 DIAGNOSIS — N186 End stage renal disease: Secondary | ICD-10-CM | POA: Diagnosis not present

## 2017-02-03 DIAGNOSIS — D509 Iron deficiency anemia, unspecified: Secondary | ICD-10-CM | POA: Diagnosis not present

## 2017-02-03 DIAGNOSIS — N186 End stage renal disease: Secondary | ICD-10-CM | POA: Diagnosis not present

## 2017-02-03 DIAGNOSIS — N2581 Secondary hyperparathyroidism of renal origin: Secondary | ICD-10-CM | POA: Diagnosis not present

## 2017-02-05 DIAGNOSIS — N2581 Secondary hyperparathyroidism of renal origin: Secondary | ICD-10-CM | POA: Diagnosis not present

## 2017-02-05 DIAGNOSIS — N186 End stage renal disease: Secondary | ICD-10-CM | POA: Diagnosis not present

## 2017-02-05 DIAGNOSIS — D509 Iron deficiency anemia, unspecified: Secondary | ICD-10-CM | POA: Diagnosis not present

## 2017-02-07 DIAGNOSIS — D509 Iron deficiency anemia, unspecified: Secondary | ICD-10-CM | POA: Diagnosis not present

## 2017-02-07 DIAGNOSIS — N186 End stage renal disease: Secondary | ICD-10-CM | POA: Diagnosis not present

## 2017-02-07 DIAGNOSIS — N2581 Secondary hyperparathyroidism of renal origin: Secondary | ICD-10-CM | POA: Diagnosis not present

## 2017-02-10 DIAGNOSIS — N2581 Secondary hyperparathyroidism of renal origin: Secondary | ICD-10-CM | POA: Diagnosis not present

## 2017-02-10 DIAGNOSIS — D509 Iron deficiency anemia, unspecified: Secondary | ICD-10-CM | POA: Diagnosis not present

## 2017-02-10 DIAGNOSIS — N186 End stage renal disease: Secondary | ICD-10-CM | POA: Diagnosis not present

## 2017-02-12 DIAGNOSIS — D509 Iron deficiency anemia, unspecified: Secondary | ICD-10-CM | POA: Diagnosis not present

## 2017-02-12 DIAGNOSIS — N2581 Secondary hyperparathyroidism of renal origin: Secondary | ICD-10-CM | POA: Diagnosis not present

## 2017-02-12 DIAGNOSIS — N186 End stage renal disease: Secondary | ICD-10-CM | POA: Diagnosis not present

## 2017-02-14 DIAGNOSIS — N186 End stage renal disease: Secondary | ICD-10-CM | POA: Diagnosis not present

## 2017-02-14 DIAGNOSIS — D509 Iron deficiency anemia, unspecified: Secondary | ICD-10-CM | POA: Diagnosis not present

## 2017-02-14 DIAGNOSIS — N2581 Secondary hyperparathyroidism of renal origin: Secondary | ICD-10-CM | POA: Diagnosis not present

## 2017-02-17 DIAGNOSIS — D509 Iron deficiency anemia, unspecified: Secondary | ICD-10-CM | POA: Diagnosis not present

## 2017-02-17 DIAGNOSIS — N186 End stage renal disease: Secondary | ICD-10-CM | POA: Diagnosis not present

## 2017-02-17 DIAGNOSIS — N2581 Secondary hyperparathyroidism of renal origin: Secondary | ICD-10-CM | POA: Diagnosis not present

## 2017-02-19 DIAGNOSIS — D509 Iron deficiency anemia, unspecified: Secondary | ICD-10-CM | POA: Diagnosis not present

## 2017-02-19 DIAGNOSIS — N186 End stage renal disease: Secondary | ICD-10-CM | POA: Diagnosis not present

## 2017-02-19 DIAGNOSIS — N2581 Secondary hyperparathyroidism of renal origin: Secondary | ICD-10-CM | POA: Diagnosis not present

## 2017-02-21 DIAGNOSIS — D509 Iron deficiency anemia, unspecified: Secondary | ICD-10-CM | POA: Diagnosis not present

## 2017-02-21 DIAGNOSIS — N186 End stage renal disease: Secondary | ICD-10-CM | POA: Diagnosis not present

## 2017-02-21 DIAGNOSIS — N2581 Secondary hyperparathyroidism of renal origin: Secondary | ICD-10-CM | POA: Diagnosis not present

## 2017-02-23 DIAGNOSIS — N186 End stage renal disease: Secondary | ICD-10-CM | POA: Diagnosis not present

## 2017-02-23 DIAGNOSIS — Z992 Dependence on renal dialysis: Secondary | ICD-10-CM | POA: Diagnosis not present

## 2017-02-24 DIAGNOSIS — N186 End stage renal disease: Secondary | ICD-10-CM | POA: Diagnosis not present

## 2017-02-24 DIAGNOSIS — D509 Iron deficiency anemia, unspecified: Secondary | ICD-10-CM | POA: Diagnosis not present

## 2017-02-24 DIAGNOSIS — N2581 Secondary hyperparathyroidism of renal origin: Secondary | ICD-10-CM | POA: Diagnosis not present

## 2017-02-26 DIAGNOSIS — N2581 Secondary hyperparathyroidism of renal origin: Secondary | ICD-10-CM | POA: Diagnosis not present

## 2017-02-26 DIAGNOSIS — D509 Iron deficiency anemia, unspecified: Secondary | ICD-10-CM | POA: Diagnosis not present

## 2017-02-26 DIAGNOSIS — N186 End stage renal disease: Secondary | ICD-10-CM | POA: Diagnosis not present

## 2017-02-28 DIAGNOSIS — N2581 Secondary hyperparathyroidism of renal origin: Secondary | ICD-10-CM | POA: Diagnosis not present

## 2017-02-28 DIAGNOSIS — N186 End stage renal disease: Secondary | ICD-10-CM | POA: Diagnosis not present

## 2017-02-28 DIAGNOSIS — D509 Iron deficiency anemia, unspecified: Secondary | ICD-10-CM | POA: Diagnosis not present

## 2017-03-03 DIAGNOSIS — N186 End stage renal disease: Secondary | ICD-10-CM | POA: Diagnosis not present

## 2017-03-03 DIAGNOSIS — D509 Iron deficiency anemia, unspecified: Secondary | ICD-10-CM | POA: Diagnosis not present

## 2017-03-03 DIAGNOSIS — N2581 Secondary hyperparathyroidism of renal origin: Secondary | ICD-10-CM | POA: Diagnosis not present

## 2017-03-05 DIAGNOSIS — N186 End stage renal disease: Secondary | ICD-10-CM | POA: Diagnosis not present

## 2017-03-05 DIAGNOSIS — N2581 Secondary hyperparathyroidism of renal origin: Secondary | ICD-10-CM | POA: Diagnosis not present

## 2017-03-05 DIAGNOSIS — D509 Iron deficiency anemia, unspecified: Secondary | ICD-10-CM | POA: Diagnosis not present

## 2017-03-07 DIAGNOSIS — D509 Iron deficiency anemia, unspecified: Secondary | ICD-10-CM | POA: Diagnosis not present

## 2017-03-07 DIAGNOSIS — N2581 Secondary hyperparathyroidism of renal origin: Secondary | ICD-10-CM | POA: Diagnosis not present

## 2017-03-07 DIAGNOSIS — N186 End stage renal disease: Secondary | ICD-10-CM | POA: Diagnosis not present

## 2017-03-10 DIAGNOSIS — N2581 Secondary hyperparathyroidism of renal origin: Secondary | ICD-10-CM | POA: Diagnosis not present

## 2017-03-10 DIAGNOSIS — D509 Iron deficiency anemia, unspecified: Secondary | ICD-10-CM | POA: Diagnosis not present

## 2017-03-10 DIAGNOSIS — N186 End stage renal disease: Secondary | ICD-10-CM | POA: Diagnosis not present

## 2017-03-12 DIAGNOSIS — N186 End stage renal disease: Secondary | ICD-10-CM | POA: Diagnosis not present

## 2017-03-12 DIAGNOSIS — N2581 Secondary hyperparathyroidism of renal origin: Secondary | ICD-10-CM | POA: Diagnosis not present

## 2017-03-12 DIAGNOSIS — D509 Iron deficiency anemia, unspecified: Secondary | ICD-10-CM | POA: Diagnosis not present

## 2017-03-14 DIAGNOSIS — N2581 Secondary hyperparathyroidism of renal origin: Secondary | ICD-10-CM | POA: Diagnosis not present

## 2017-03-14 DIAGNOSIS — D509 Iron deficiency anemia, unspecified: Secondary | ICD-10-CM | POA: Diagnosis not present

## 2017-03-14 DIAGNOSIS — N186 End stage renal disease: Secondary | ICD-10-CM | POA: Diagnosis not present

## 2017-03-17 DIAGNOSIS — N186 End stage renal disease: Secondary | ICD-10-CM | POA: Diagnosis not present

## 2017-03-17 DIAGNOSIS — D509 Iron deficiency anemia, unspecified: Secondary | ICD-10-CM | POA: Diagnosis not present

## 2017-03-17 DIAGNOSIS — N2581 Secondary hyperparathyroidism of renal origin: Secondary | ICD-10-CM | POA: Diagnosis not present

## 2017-03-19 DIAGNOSIS — N2581 Secondary hyperparathyroidism of renal origin: Secondary | ICD-10-CM | POA: Diagnosis not present

## 2017-03-19 DIAGNOSIS — N186 End stage renal disease: Secondary | ICD-10-CM | POA: Diagnosis not present

## 2017-03-19 DIAGNOSIS — D509 Iron deficiency anemia, unspecified: Secondary | ICD-10-CM | POA: Diagnosis not present

## 2017-03-21 DIAGNOSIS — N2581 Secondary hyperparathyroidism of renal origin: Secondary | ICD-10-CM | POA: Diagnosis not present

## 2017-03-21 DIAGNOSIS — D509 Iron deficiency anemia, unspecified: Secondary | ICD-10-CM | POA: Diagnosis not present

## 2017-03-21 DIAGNOSIS — N186 End stage renal disease: Secondary | ICD-10-CM | POA: Diagnosis not present

## 2017-03-24 DIAGNOSIS — N2581 Secondary hyperparathyroidism of renal origin: Secondary | ICD-10-CM | POA: Diagnosis not present

## 2017-03-24 DIAGNOSIS — D509 Iron deficiency anemia, unspecified: Secondary | ICD-10-CM | POA: Diagnosis not present

## 2017-03-24 DIAGNOSIS — N186 End stage renal disease: Secondary | ICD-10-CM | POA: Diagnosis not present

## 2017-03-26 DIAGNOSIS — Z992 Dependence on renal dialysis: Secondary | ICD-10-CM | POA: Diagnosis not present

## 2017-03-26 DIAGNOSIS — N186 End stage renal disease: Secondary | ICD-10-CM | POA: Diagnosis not present

## 2017-03-26 DIAGNOSIS — D509 Iron deficiency anemia, unspecified: Secondary | ICD-10-CM | POA: Diagnosis not present

## 2017-03-26 DIAGNOSIS — N2581 Secondary hyperparathyroidism of renal origin: Secondary | ICD-10-CM | POA: Diagnosis not present

## 2017-03-28 DIAGNOSIS — D509 Iron deficiency anemia, unspecified: Secondary | ICD-10-CM | POA: Diagnosis not present

## 2017-03-28 DIAGNOSIS — N2581 Secondary hyperparathyroidism of renal origin: Secondary | ICD-10-CM | POA: Diagnosis not present

## 2017-03-28 DIAGNOSIS — N186 End stage renal disease: Secondary | ICD-10-CM | POA: Diagnosis not present

## 2017-03-31 DIAGNOSIS — N186 End stage renal disease: Secondary | ICD-10-CM | POA: Diagnosis not present

## 2017-03-31 DIAGNOSIS — N2581 Secondary hyperparathyroidism of renal origin: Secondary | ICD-10-CM | POA: Diagnosis not present

## 2017-03-31 DIAGNOSIS — D509 Iron deficiency anemia, unspecified: Secondary | ICD-10-CM | POA: Diagnosis not present

## 2017-04-02 DIAGNOSIS — N2581 Secondary hyperparathyroidism of renal origin: Secondary | ICD-10-CM | POA: Diagnosis not present

## 2017-04-02 DIAGNOSIS — D509 Iron deficiency anemia, unspecified: Secondary | ICD-10-CM | POA: Diagnosis not present

## 2017-04-02 DIAGNOSIS — N186 End stage renal disease: Secondary | ICD-10-CM | POA: Diagnosis not present

## 2017-04-04 DIAGNOSIS — D509 Iron deficiency anemia, unspecified: Secondary | ICD-10-CM | POA: Diagnosis not present

## 2017-04-04 DIAGNOSIS — N2581 Secondary hyperparathyroidism of renal origin: Secondary | ICD-10-CM | POA: Diagnosis not present

## 2017-04-04 DIAGNOSIS — N186 End stage renal disease: Secondary | ICD-10-CM | POA: Diagnosis not present

## 2017-04-07 DIAGNOSIS — N186 End stage renal disease: Secondary | ICD-10-CM | POA: Diagnosis not present

## 2017-04-07 DIAGNOSIS — D509 Iron deficiency anemia, unspecified: Secondary | ICD-10-CM | POA: Diagnosis not present

## 2017-04-07 DIAGNOSIS — N2581 Secondary hyperparathyroidism of renal origin: Secondary | ICD-10-CM | POA: Diagnosis not present

## 2017-04-09 DIAGNOSIS — N2581 Secondary hyperparathyroidism of renal origin: Secondary | ICD-10-CM | POA: Diagnosis not present

## 2017-04-09 DIAGNOSIS — D509 Iron deficiency anemia, unspecified: Secondary | ICD-10-CM | POA: Diagnosis not present

## 2017-04-09 DIAGNOSIS — N186 End stage renal disease: Secondary | ICD-10-CM | POA: Diagnosis not present

## 2017-04-11 DIAGNOSIS — N2581 Secondary hyperparathyroidism of renal origin: Secondary | ICD-10-CM | POA: Diagnosis not present

## 2017-04-11 DIAGNOSIS — N186 End stage renal disease: Secondary | ICD-10-CM | POA: Diagnosis not present

## 2017-04-11 DIAGNOSIS — D509 Iron deficiency anemia, unspecified: Secondary | ICD-10-CM | POA: Diagnosis not present

## 2017-04-14 DIAGNOSIS — N2581 Secondary hyperparathyroidism of renal origin: Secondary | ICD-10-CM | POA: Diagnosis not present

## 2017-04-14 DIAGNOSIS — N186 End stage renal disease: Secondary | ICD-10-CM | POA: Diagnosis not present

## 2017-04-14 DIAGNOSIS — D509 Iron deficiency anemia, unspecified: Secondary | ICD-10-CM | POA: Diagnosis not present

## 2017-04-16 DIAGNOSIS — D509 Iron deficiency anemia, unspecified: Secondary | ICD-10-CM | POA: Diagnosis not present

## 2017-04-16 DIAGNOSIS — N2581 Secondary hyperparathyroidism of renal origin: Secondary | ICD-10-CM | POA: Diagnosis not present

## 2017-04-16 DIAGNOSIS — N186 End stage renal disease: Secondary | ICD-10-CM | POA: Diagnosis not present

## 2017-04-18 DIAGNOSIS — N2581 Secondary hyperparathyroidism of renal origin: Secondary | ICD-10-CM | POA: Diagnosis not present

## 2017-04-18 DIAGNOSIS — N186 End stage renal disease: Secondary | ICD-10-CM | POA: Diagnosis not present

## 2017-04-18 DIAGNOSIS — D509 Iron deficiency anemia, unspecified: Secondary | ICD-10-CM | POA: Diagnosis not present

## 2017-04-21 DIAGNOSIS — D509 Iron deficiency anemia, unspecified: Secondary | ICD-10-CM | POA: Diagnosis not present

## 2017-04-21 DIAGNOSIS — N2581 Secondary hyperparathyroidism of renal origin: Secondary | ICD-10-CM | POA: Diagnosis not present

## 2017-04-21 DIAGNOSIS — N186 End stage renal disease: Secondary | ICD-10-CM | POA: Diagnosis not present

## 2017-04-23 DIAGNOSIS — N2581 Secondary hyperparathyroidism of renal origin: Secondary | ICD-10-CM | POA: Diagnosis not present

## 2017-04-23 DIAGNOSIS — D509 Iron deficiency anemia, unspecified: Secondary | ICD-10-CM | POA: Diagnosis not present

## 2017-04-23 DIAGNOSIS — N186 End stage renal disease: Secondary | ICD-10-CM | POA: Diagnosis not present

## 2017-04-25 DIAGNOSIS — N186 End stage renal disease: Secondary | ICD-10-CM | POA: Diagnosis not present

## 2017-04-25 DIAGNOSIS — D509 Iron deficiency anemia, unspecified: Secondary | ICD-10-CM | POA: Diagnosis not present

## 2017-04-25 DIAGNOSIS — N2581 Secondary hyperparathyroidism of renal origin: Secondary | ICD-10-CM | POA: Diagnosis not present

## 2017-04-25 DIAGNOSIS — Z992 Dependence on renal dialysis: Secondary | ICD-10-CM | POA: Diagnosis not present

## 2017-04-28 DIAGNOSIS — Z992 Dependence on renal dialysis: Secondary | ICD-10-CM | POA: Diagnosis not present

## 2017-04-28 DIAGNOSIS — N186 End stage renal disease: Secondary | ICD-10-CM | POA: Diagnosis not present

## 2017-04-30 DIAGNOSIS — N186 End stage renal disease: Secondary | ICD-10-CM | POA: Diagnosis not present

## 2017-04-30 DIAGNOSIS — Z992 Dependence on renal dialysis: Secondary | ICD-10-CM | POA: Diagnosis not present

## 2017-05-02 DIAGNOSIS — Z992 Dependence on renal dialysis: Secondary | ICD-10-CM | POA: Diagnosis not present

## 2017-05-02 DIAGNOSIS — N186 End stage renal disease: Secondary | ICD-10-CM | POA: Diagnosis not present

## 2017-05-05 DIAGNOSIS — N186 End stage renal disease: Secondary | ICD-10-CM | POA: Diagnosis not present

## 2017-05-05 DIAGNOSIS — N2581 Secondary hyperparathyroidism of renal origin: Secondary | ICD-10-CM | POA: Diagnosis not present

## 2017-05-05 DIAGNOSIS — D509 Iron deficiency anemia, unspecified: Secondary | ICD-10-CM | POA: Diagnosis not present

## 2017-05-07 DIAGNOSIS — D509 Iron deficiency anemia, unspecified: Secondary | ICD-10-CM | POA: Diagnosis not present

## 2017-05-07 DIAGNOSIS — N2581 Secondary hyperparathyroidism of renal origin: Secondary | ICD-10-CM | POA: Diagnosis not present

## 2017-05-07 DIAGNOSIS — N186 End stage renal disease: Secondary | ICD-10-CM | POA: Diagnosis not present

## 2017-05-09 DIAGNOSIS — D509 Iron deficiency anemia, unspecified: Secondary | ICD-10-CM | POA: Diagnosis not present

## 2017-05-09 DIAGNOSIS — N186 End stage renal disease: Secondary | ICD-10-CM | POA: Diagnosis not present

## 2017-05-09 DIAGNOSIS — N2581 Secondary hyperparathyroidism of renal origin: Secondary | ICD-10-CM | POA: Diagnosis not present

## 2017-05-12 DIAGNOSIS — D509 Iron deficiency anemia, unspecified: Secondary | ICD-10-CM | POA: Diagnosis not present

## 2017-05-12 DIAGNOSIS — N2581 Secondary hyperparathyroidism of renal origin: Secondary | ICD-10-CM | POA: Diagnosis not present

## 2017-05-12 DIAGNOSIS — N186 End stage renal disease: Secondary | ICD-10-CM | POA: Diagnosis not present

## 2017-05-14 DIAGNOSIS — N2581 Secondary hyperparathyroidism of renal origin: Secondary | ICD-10-CM | POA: Diagnosis not present

## 2017-05-14 DIAGNOSIS — N186 End stage renal disease: Secondary | ICD-10-CM | POA: Diagnosis not present

## 2017-05-14 DIAGNOSIS — D509 Iron deficiency anemia, unspecified: Secondary | ICD-10-CM | POA: Diagnosis not present

## 2017-05-16 DIAGNOSIS — N2581 Secondary hyperparathyroidism of renal origin: Secondary | ICD-10-CM | POA: Diagnosis not present

## 2017-05-16 DIAGNOSIS — N186 End stage renal disease: Secondary | ICD-10-CM | POA: Diagnosis not present

## 2017-05-16 DIAGNOSIS — D509 Iron deficiency anemia, unspecified: Secondary | ICD-10-CM | POA: Diagnosis not present

## 2017-05-19 DIAGNOSIS — N2581 Secondary hyperparathyroidism of renal origin: Secondary | ICD-10-CM | POA: Diagnosis not present

## 2017-05-19 DIAGNOSIS — N186 End stage renal disease: Secondary | ICD-10-CM | POA: Diagnosis not present

## 2017-05-19 DIAGNOSIS — D509 Iron deficiency anemia, unspecified: Secondary | ICD-10-CM | POA: Diagnosis not present

## 2017-05-21 DIAGNOSIS — N2581 Secondary hyperparathyroidism of renal origin: Secondary | ICD-10-CM | POA: Diagnosis not present

## 2017-05-21 DIAGNOSIS — D509 Iron deficiency anemia, unspecified: Secondary | ICD-10-CM | POA: Diagnosis not present

## 2017-05-21 DIAGNOSIS — N186 End stage renal disease: Secondary | ICD-10-CM | POA: Diagnosis not present

## 2017-05-23 DIAGNOSIS — N2581 Secondary hyperparathyroidism of renal origin: Secondary | ICD-10-CM | POA: Diagnosis not present

## 2017-05-23 DIAGNOSIS — D509 Iron deficiency anemia, unspecified: Secondary | ICD-10-CM | POA: Diagnosis not present

## 2017-05-23 DIAGNOSIS — N186 End stage renal disease: Secondary | ICD-10-CM | POA: Diagnosis not present

## 2017-05-26 DIAGNOSIS — Z992 Dependence on renal dialysis: Secondary | ICD-10-CM | POA: Diagnosis not present

## 2017-05-26 DIAGNOSIS — N186 End stage renal disease: Secondary | ICD-10-CM | POA: Diagnosis not present

## 2017-05-26 DIAGNOSIS — D509 Iron deficiency anemia, unspecified: Secondary | ICD-10-CM | POA: Diagnosis not present

## 2017-05-26 DIAGNOSIS — N2581 Secondary hyperparathyroidism of renal origin: Secondary | ICD-10-CM | POA: Diagnosis not present

## 2017-05-28 DIAGNOSIS — D509 Iron deficiency anemia, unspecified: Secondary | ICD-10-CM | POA: Diagnosis not present

## 2017-05-28 DIAGNOSIS — N2581 Secondary hyperparathyroidism of renal origin: Secondary | ICD-10-CM | POA: Diagnosis not present

## 2017-05-28 DIAGNOSIS — N186 End stage renal disease: Secondary | ICD-10-CM | POA: Diagnosis not present

## 2017-05-30 DIAGNOSIS — N186 End stage renal disease: Secondary | ICD-10-CM | POA: Diagnosis not present

## 2017-05-30 DIAGNOSIS — D509 Iron deficiency anemia, unspecified: Secondary | ICD-10-CM | POA: Diagnosis not present

## 2017-05-30 DIAGNOSIS — N2581 Secondary hyperparathyroidism of renal origin: Secondary | ICD-10-CM | POA: Diagnosis not present

## 2017-06-02 DIAGNOSIS — N2581 Secondary hyperparathyroidism of renal origin: Secondary | ICD-10-CM | POA: Diagnosis not present

## 2017-06-02 DIAGNOSIS — D509 Iron deficiency anemia, unspecified: Secondary | ICD-10-CM | POA: Diagnosis not present

## 2017-06-02 DIAGNOSIS — N186 End stage renal disease: Secondary | ICD-10-CM | POA: Diagnosis not present

## 2017-06-04 DIAGNOSIS — N186 End stage renal disease: Secondary | ICD-10-CM | POA: Diagnosis not present

## 2017-06-04 DIAGNOSIS — N2581 Secondary hyperparathyroidism of renal origin: Secondary | ICD-10-CM | POA: Diagnosis not present

## 2017-06-04 DIAGNOSIS — D509 Iron deficiency anemia, unspecified: Secondary | ICD-10-CM | POA: Diagnosis not present

## 2017-06-06 DIAGNOSIS — N186 End stage renal disease: Secondary | ICD-10-CM | POA: Diagnosis not present

## 2017-06-06 DIAGNOSIS — N2581 Secondary hyperparathyroidism of renal origin: Secondary | ICD-10-CM | POA: Diagnosis not present

## 2017-06-06 DIAGNOSIS — D509 Iron deficiency anemia, unspecified: Secondary | ICD-10-CM | POA: Diagnosis not present

## 2017-06-09 DIAGNOSIS — N2581 Secondary hyperparathyroidism of renal origin: Secondary | ICD-10-CM | POA: Diagnosis not present

## 2017-06-09 DIAGNOSIS — N186 End stage renal disease: Secondary | ICD-10-CM | POA: Diagnosis not present

## 2017-06-09 DIAGNOSIS — D509 Iron deficiency anemia, unspecified: Secondary | ICD-10-CM | POA: Diagnosis not present

## 2017-06-11 DIAGNOSIS — N186 End stage renal disease: Secondary | ICD-10-CM | POA: Diagnosis not present

## 2017-06-11 DIAGNOSIS — N2581 Secondary hyperparathyroidism of renal origin: Secondary | ICD-10-CM | POA: Diagnosis not present

## 2017-06-11 DIAGNOSIS — D509 Iron deficiency anemia, unspecified: Secondary | ICD-10-CM | POA: Diagnosis not present

## 2017-06-13 DIAGNOSIS — D509 Iron deficiency anemia, unspecified: Secondary | ICD-10-CM | POA: Diagnosis not present

## 2017-06-13 DIAGNOSIS — N2581 Secondary hyperparathyroidism of renal origin: Secondary | ICD-10-CM | POA: Diagnosis not present

## 2017-06-13 DIAGNOSIS — N186 End stage renal disease: Secondary | ICD-10-CM | POA: Diagnosis not present

## 2017-06-16 DIAGNOSIS — N186 End stage renal disease: Secondary | ICD-10-CM | POA: Diagnosis not present

## 2017-06-16 DIAGNOSIS — D509 Iron deficiency anemia, unspecified: Secondary | ICD-10-CM | POA: Diagnosis not present

## 2017-06-16 DIAGNOSIS — N2581 Secondary hyperparathyroidism of renal origin: Secondary | ICD-10-CM | POA: Diagnosis not present

## 2017-06-18 DIAGNOSIS — N2581 Secondary hyperparathyroidism of renal origin: Secondary | ICD-10-CM | POA: Diagnosis not present

## 2017-06-18 DIAGNOSIS — N186 End stage renal disease: Secondary | ICD-10-CM | POA: Diagnosis not present

## 2017-06-18 DIAGNOSIS — D509 Iron deficiency anemia, unspecified: Secondary | ICD-10-CM | POA: Diagnosis not present

## 2017-06-20 DIAGNOSIS — D509 Iron deficiency anemia, unspecified: Secondary | ICD-10-CM | POA: Diagnosis not present

## 2017-06-20 DIAGNOSIS — N186 End stage renal disease: Secondary | ICD-10-CM | POA: Diagnosis not present

## 2017-06-20 DIAGNOSIS — N2581 Secondary hyperparathyroidism of renal origin: Secondary | ICD-10-CM | POA: Diagnosis not present

## 2017-06-23 DIAGNOSIS — N186 End stage renal disease: Secondary | ICD-10-CM | POA: Diagnosis not present

## 2017-06-23 DIAGNOSIS — D509 Iron deficiency anemia, unspecified: Secondary | ICD-10-CM | POA: Diagnosis not present

## 2017-06-23 DIAGNOSIS — N2581 Secondary hyperparathyroidism of renal origin: Secondary | ICD-10-CM | POA: Diagnosis not present

## 2017-06-25 DIAGNOSIS — D509 Iron deficiency anemia, unspecified: Secondary | ICD-10-CM | POA: Diagnosis not present

## 2017-06-25 DIAGNOSIS — N186 End stage renal disease: Secondary | ICD-10-CM | POA: Diagnosis not present

## 2017-06-25 DIAGNOSIS — N2581 Secondary hyperparathyroidism of renal origin: Secondary | ICD-10-CM | POA: Diagnosis not present

## 2017-06-26 DIAGNOSIS — N186 End stage renal disease: Secondary | ICD-10-CM | POA: Diagnosis not present

## 2017-06-26 DIAGNOSIS — Z992 Dependence on renal dialysis: Secondary | ICD-10-CM | POA: Diagnosis not present

## 2017-06-27 DIAGNOSIS — N2581 Secondary hyperparathyroidism of renal origin: Secondary | ICD-10-CM | POA: Diagnosis not present

## 2017-06-27 DIAGNOSIS — D509 Iron deficiency anemia, unspecified: Secondary | ICD-10-CM | POA: Diagnosis not present

## 2017-06-27 DIAGNOSIS — N186 End stage renal disease: Secondary | ICD-10-CM | POA: Diagnosis not present

## 2017-06-27 DIAGNOSIS — D631 Anemia in chronic kidney disease: Secondary | ICD-10-CM | POA: Diagnosis not present

## 2017-06-30 DIAGNOSIS — D509 Iron deficiency anemia, unspecified: Secondary | ICD-10-CM | POA: Diagnosis not present

## 2017-06-30 DIAGNOSIS — D631 Anemia in chronic kidney disease: Secondary | ICD-10-CM | POA: Diagnosis not present

## 2017-06-30 DIAGNOSIS — N2581 Secondary hyperparathyroidism of renal origin: Secondary | ICD-10-CM | POA: Diagnosis not present

## 2017-06-30 DIAGNOSIS — N186 End stage renal disease: Secondary | ICD-10-CM | POA: Diagnosis not present

## 2017-07-02 DIAGNOSIS — N186 End stage renal disease: Secondary | ICD-10-CM | POA: Diagnosis not present

## 2017-07-02 DIAGNOSIS — N2581 Secondary hyperparathyroidism of renal origin: Secondary | ICD-10-CM | POA: Diagnosis not present

## 2017-07-02 DIAGNOSIS — D509 Iron deficiency anemia, unspecified: Secondary | ICD-10-CM | POA: Diagnosis not present

## 2017-07-02 DIAGNOSIS — D631 Anemia in chronic kidney disease: Secondary | ICD-10-CM | POA: Diagnosis not present

## 2017-07-04 DIAGNOSIS — N2581 Secondary hyperparathyroidism of renal origin: Secondary | ICD-10-CM | POA: Diagnosis not present

## 2017-07-04 DIAGNOSIS — D509 Iron deficiency anemia, unspecified: Secondary | ICD-10-CM | POA: Diagnosis not present

## 2017-07-04 DIAGNOSIS — N186 End stage renal disease: Secondary | ICD-10-CM | POA: Diagnosis not present

## 2017-07-04 DIAGNOSIS — D631 Anemia in chronic kidney disease: Secondary | ICD-10-CM | POA: Diagnosis not present

## 2017-07-07 DIAGNOSIS — N2581 Secondary hyperparathyroidism of renal origin: Secondary | ICD-10-CM | POA: Diagnosis not present

## 2017-07-07 DIAGNOSIS — N186 End stage renal disease: Secondary | ICD-10-CM | POA: Diagnosis not present

## 2017-07-07 DIAGNOSIS — D509 Iron deficiency anemia, unspecified: Secondary | ICD-10-CM | POA: Diagnosis not present

## 2017-07-07 DIAGNOSIS — D631 Anemia in chronic kidney disease: Secondary | ICD-10-CM | POA: Diagnosis not present

## 2017-07-09 DIAGNOSIS — D631 Anemia in chronic kidney disease: Secondary | ICD-10-CM | POA: Diagnosis not present

## 2017-07-09 DIAGNOSIS — N2581 Secondary hyperparathyroidism of renal origin: Secondary | ICD-10-CM | POA: Diagnosis not present

## 2017-07-09 DIAGNOSIS — D509 Iron deficiency anemia, unspecified: Secondary | ICD-10-CM | POA: Diagnosis not present

## 2017-07-09 DIAGNOSIS — N186 End stage renal disease: Secondary | ICD-10-CM | POA: Diagnosis not present

## 2017-07-11 DIAGNOSIS — N2581 Secondary hyperparathyroidism of renal origin: Secondary | ICD-10-CM | POA: Diagnosis not present

## 2017-07-11 DIAGNOSIS — D509 Iron deficiency anemia, unspecified: Secondary | ICD-10-CM | POA: Diagnosis not present

## 2017-07-11 DIAGNOSIS — N186 End stage renal disease: Secondary | ICD-10-CM | POA: Diagnosis not present

## 2017-07-11 DIAGNOSIS — D631 Anemia in chronic kidney disease: Secondary | ICD-10-CM | POA: Diagnosis not present

## 2017-07-14 DIAGNOSIS — D509 Iron deficiency anemia, unspecified: Secondary | ICD-10-CM | POA: Diagnosis not present

## 2017-07-14 DIAGNOSIS — N186 End stage renal disease: Secondary | ICD-10-CM | POA: Diagnosis not present

## 2017-07-14 DIAGNOSIS — D631 Anemia in chronic kidney disease: Secondary | ICD-10-CM | POA: Diagnosis not present

## 2017-07-14 DIAGNOSIS — N2581 Secondary hyperparathyroidism of renal origin: Secondary | ICD-10-CM | POA: Diagnosis not present

## 2017-07-16 DIAGNOSIS — N2581 Secondary hyperparathyroidism of renal origin: Secondary | ICD-10-CM | POA: Diagnosis not present

## 2017-07-16 DIAGNOSIS — D631 Anemia in chronic kidney disease: Secondary | ICD-10-CM | POA: Diagnosis not present

## 2017-07-16 DIAGNOSIS — D509 Iron deficiency anemia, unspecified: Secondary | ICD-10-CM | POA: Diagnosis not present

## 2017-07-16 DIAGNOSIS — N186 End stage renal disease: Secondary | ICD-10-CM | POA: Diagnosis not present

## 2017-07-18 DIAGNOSIS — N186 End stage renal disease: Secondary | ICD-10-CM | POA: Diagnosis not present

## 2017-07-18 DIAGNOSIS — D509 Iron deficiency anemia, unspecified: Secondary | ICD-10-CM | POA: Diagnosis not present

## 2017-07-18 DIAGNOSIS — N2581 Secondary hyperparathyroidism of renal origin: Secondary | ICD-10-CM | POA: Diagnosis not present

## 2017-07-18 DIAGNOSIS — D631 Anemia in chronic kidney disease: Secondary | ICD-10-CM | POA: Diagnosis not present

## 2017-07-19 DIAGNOSIS — B349 Viral infection, unspecified: Secondary | ICD-10-CM | POA: Diagnosis not present

## 2017-07-21 DIAGNOSIS — D631 Anemia in chronic kidney disease: Secondary | ICD-10-CM | POA: Diagnosis not present

## 2017-07-21 DIAGNOSIS — N186 End stage renal disease: Secondary | ICD-10-CM | POA: Diagnosis not present

## 2017-07-21 DIAGNOSIS — D509 Iron deficiency anemia, unspecified: Secondary | ICD-10-CM | POA: Diagnosis not present

## 2017-07-21 DIAGNOSIS — N2581 Secondary hyperparathyroidism of renal origin: Secondary | ICD-10-CM | POA: Diagnosis not present

## 2017-07-23 DIAGNOSIS — N186 End stage renal disease: Secondary | ICD-10-CM | POA: Diagnosis not present

## 2017-07-23 DIAGNOSIS — D631 Anemia in chronic kidney disease: Secondary | ICD-10-CM | POA: Diagnosis not present

## 2017-07-23 DIAGNOSIS — N2581 Secondary hyperparathyroidism of renal origin: Secondary | ICD-10-CM | POA: Diagnosis not present

## 2017-07-23 DIAGNOSIS — D509 Iron deficiency anemia, unspecified: Secondary | ICD-10-CM | POA: Diagnosis not present

## 2017-07-25 DIAGNOSIS — N2581 Secondary hyperparathyroidism of renal origin: Secondary | ICD-10-CM | POA: Diagnosis not present

## 2017-07-25 DIAGNOSIS — N186 End stage renal disease: Secondary | ICD-10-CM | POA: Diagnosis not present

## 2017-07-25 DIAGNOSIS — D631 Anemia in chronic kidney disease: Secondary | ICD-10-CM | POA: Diagnosis not present

## 2017-07-25 DIAGNOSIS — D509 Iron deficiency anemia, unspecified: Secondary | ICD-10-CM | POA: Diagnosis not present

## 2017-07-26 DIAGNOSIS — N186 End stage renal disease: Secondary | ICD-10-CM | POA: Diagnosis not present

## 2017-07-26 DIAGNOSIS — Z992 Dependence on renal dialysis: Secondary | ICD-10-CM | POA: Diagnosis not present

## 2017-07-27 ENCOUNTER — Other Ambulatory Visit (INDEPENDENT_AMBULATORY_CARE_PROVIDER_SITE_OTHER): Payer: Medicare Other

## 2017-07-27 ENCOUNTER — Encounter: Payer: Self-pay | Admitting: Family Medicine

## 2017-07-27 ENCOUNTER — Telehealth: Payer: Self-pay | Admitting: Family Medicine

## 2017-07-27 ENCOUNTER — Ambulatory Visit (INDEPENDENT_AMBULATORY_CARE_PROVIDER_SITE_OTHER): Payer: Medicare Other | Admitting: Family Medicine

## 2017-07-27 VITALS — BP 130/88 | HR 70 | Temp 98.6°F | Ht 68.0 in | Wt 225.8 lb

## 2017-07-27 DIAGNOSIS — R1030 Lower abdominal pain, unspecified: Secondary | ICD-10-CM

## 2017-07-27 LAB — CBC WITH DIFFERENTIAL/PLATELET
BASOS PCT: 0.8 % (ref 0.0–3.0)
Basophils Absolute: 0.1 10*3/uL (ref 0.0–0.1)
EOS ABS: 0.3 10*3/uL (ref 0.0–0.7)
EOS PCT: 1.8 % (ref 0.0–5.0)
HEMATOCRIT: 34.8 % — AB (ref 39.0–52.0)
Hemoglobin: 11.1 g/dL — ABNORMAL LOW (ref 13.0–17.0)
Lymphs Abs: 1.2 10*3/uL (ref 0.7–4.0)
MCHC: 32 g/dL (ref 30.0–36.0)
MCV: 96.4 fl (ref 78.0–100.0)
Monocytes Absolute: 1.4 10*3/uL — ABNORMAL HIGH (ref 0.1–1.0)
Monocytes Relative: 9.1 % (ref 3.0–12.0)
NEUTROS ABS: 12.2 10*3/uL — AB (ref 1.4–7.7)
Neutrophils Relative %: 80.5 % — ABNORMAL HIGH (ref 43.0–77.0)
PLATELETS: 276 10*3/uL (ref 150.0–400.0)
RBC: 3.61 Mil/uL — ABNORMAL LOW (ref 4.22–5.81)
RDW: 14.6 % (ref 11.5–15.5)
WBC: 15.1 10*3/uL — ABNORMAL HIGH (ref 4.0–10.5)

## 2017-07-27 LAB — C-REACTIVE PROTEIN: CRP: 20.5 mg/dL — ABNORMAL HIGH (ref 0.5–20.0)

## 2017-07-27 MED ORDER — CIPROFLOXACIN HCL 500 MG PO TABS: 500.0000 mg | ORAL_TABLET | Freq: Every day | ORAL | 0 refills | Status: AC

## 2017-07-27 MED ORDER — METRONIDAZOLE 500 MG PO TABS: 500.0000 mg | ORAL_TABLET | Freq: Two times a day (BID) | ORAL | 0 refills | Status: AC

## 2017-07-27 NOTE — Patient Instructions (Signed)
Thank you for coming in,   We will call you with the results.   Please try to a probiotic.   Please follow up with me if you symptoms worsen.    Please feel free to call with any questions or concerns at any time, at [phone]. --Dr. Jordan Likes   Diverticulosis Diverticulosis is a condition that develops when small pouches (diverticula) form in the wall of the large intestine (colon). The colon is where water is absorbed and stool is formed. The pouches form when the inside layer of the colon pushes through weak spots in the outer layers of the colon. You may have a few pouches or many of them. What are the causes? The cause of this condition is not known. What increases the risk? The following factors may make you more likely to develop this condition:  Being older than age 35. Your risk for this condition increases with age. Diverticulosis is rare among people younger than age 18. By age 85, many people have it.  Eating a low-fiber diet.  Having frequent constipation.  Being overweight.  Not getting enough exercise.  Smoking.  Taking over-the-counter pain medicines, like aspirin and ibuprofen.  Having a family history of diverticulosis.  What are the signs or symptoms? In most people, there are no symptoms of this condition. If you do have symptoms, they may include:  Bloating.  Cramps in the abdomen.  Constipation or diarrhea.  Pain in the lower left side of the abdomen.  How is this diagnosed? This condition is most often diagnosed during an exam for other colon problems. Because diverticulosis usually has no symptoms, it often cannot be diagnosed independently. This condition may be diagnosed by:  Using a flexible scope to examine the colon (colonoscopy).  Taking an X-ray of the colon after dye has been put into the colon (barium enema).  Doing a CT scan.  How is this treated? You may not need treatment for this condition if you have never developed an  infection related to diverticulosis. If you have had an infection before, treatment may include:  Eating a high-fiber diet. This may include eating more fruits, vegetables, and grains.  Taking a fiber supplement.  Taking a live bacteria supplement (probiotic).  Taking medicine to relax your colon.  Taking antibiotic medicines.  Follow these instructions at home:  Drink 6-8 glasses of water or more each day to prevent constipation.  Try not to strain when you have a bowel movement.  If you have had an infection before: ? Eat more fiber as directed by your health care provider or your diet and nutrition specialist (dietitian). ? Take a fiber supplement or probiotic, if your health care provider approves.  Take over-the-counter and prescription medicines only as told by your health care provider.  If you were prescribed an antibiotic, take it as told by your health care provider. Do not stop taking the antibiotic even if you start to feel better.  Keep all follow-up visits as told by your health care provider. This is important. Contact a health care provider if:  You have pain in your abdomen.  You have bloating.  You have cramps.  You have not had a bowel movement in 3 days. Get help right away if:  Your pain gets worse.  Your bloating becomes very bad.  You have a fever or chills, and your symptoms suddenly get worse.  You vomit.  You have bowel movements that are bloody or black.  You have bleeding from  your rectum. Summary  Diverticulosis is a condition that develops when small pouches (diverticula) form in the wall of the large intestine (colon).  You may have a few pouches or many of them.  This condition is most often diagnosed during an exam for other colon problems.  If you have had an infection related to diverticulosis, treatment may include increasing the fiber in your diet, taking supplements, or taking medicines. This information is not intended  to replace advice given to you by your health care provider. Make sure you discuss any questions you have with your health care provider. Document Released: 07/10/2004 Document Revised: 09/01/2016 Document Reviewed: 09/01/2016 Elsevier Interactive Patient Education  2017 [employer].

## 2017-07-27 NOTE — Assessment & Plan Note (Signed)
Symptoms are suggestive of a viral gastroenteritis. He seems to be improving based on some of his symptoms today. - Continue supportive care. Can try Imodium and Pepto-Bismol - CBC and therapy. If elevated will suggest diverticulitis and would try antibiotics - Given indications to return.

## 2017-07-27 NOTE — Telephone Encounter (Signed)
Exam and labs are consistent with acute diverticulitis.   Myra Rude, MD [facility] 07/27/2017, 4:51 PM

## 2017-07-27 NOTE — Progress Notes (Signed)
Tyler Franco - 51 y.o. male MRN [id number]  Date of birth: 1966/06/07  SUBJECTIVE:  Including CC & ROS.  Chief Complaint  Patient presents with  . Diarrhea    stomach pain has been going on for a while but the diarrhea started on Thursday after eating at a resteraunt. Patient states that he is having diarrhea twice a day.     Tyler Franco is a 51 year old male is presenting with lower abdominal quadrant pain. He reports his been ongoing for about 2 weeks. He has been seen at urgent care and told he had a viral problem. His symptoms initially started after he ate some food and had some pain following this. He had some fevers in the beginning that were up to 102. He also had subsequent diarrhea that was watery in nature. This has been nonbloody. Since the past couple days it has been becoming more formed. He has not had any fevers recently. He denies any vomiting. He has had some nausea. He has not taken any antibiotics during this course. He denies any back pain. He is on dialysis and does not produce any urine.   Abdominal CT scan from 09/26/15 shows bilateral renal cysts, mild scattered calcified atherosclerotic plaque, possible proctitis, colonic diverticulitis without evidence of diverticulitis, bilateral SI joint osseous changes suggestive of chronic sacroiliitis. He had a colonoscopy performed in 07/18/2015 but unable to see the read.  Review of Systems  Constitutional: Negative for fever.  Gastrointestinal: Positive for nausea.  Skin: Negative for color change.  Neurological: Negative for weakness and numbness.  Hematological: Negative for adenopathy.    HISTORY: Past Medical, Surgical, Social, and Family History Reviewed & Updated per EMR.   Pertinent Historical Findings include:  Past Medical History:  Diagnosis Date  . Anemia, unspecified   . Chronic kidney disease, unspecified   . 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 Gout, unspecified   . Hyperpotassemia   .  Immune thrombocytopenic purpura (HCC)   . Other and unspecified hyperlipidemia   . Systemic lupus erythematosus (HCC)   . Unspecified essential hypertension     Past Surgical History:  Procedure Laterality Date  . PARATHYROIDECTOMY     [facility]    Allergies  Allergen Reactions  . Cinacalcet Itching  . Naproxen Sodium     Family History  Problem Relation Age of Onset  . Hypertension Mother   . Lupus Mother   . Hypertension Father      Social History   Social History  . Marital status: Married    Spouse name: N/A  . Number of children: N/A  . Years of education: N/A   Occupational History  . Not on file.   Social History Main Topics  . Smoking status: Never Smoker  . Smokeless tobacco: Never Used  . Alcohol use No  . Drug use: No  . Sexual activity: Yes   Other Topics Concern  . Not on file   Social History Narrative   Occupation: layed off, now got a job at [employer]   Married    Never smoked   Alcohol use- no   Regular exercise- swimming     PHYSICAL EXAM:  VS: BP 130/88 (BP Location: Right Arm, Patient Position: Sitting, Cuff Size: Large)   Pulse 70   Temp 98.6 F (37 C) (Oral)   Ht  (1.727 m)   Wt 225 lb 12 oz (102.4 kg)   SpO2 98%  BMI 34.33 kg/m  Physical Exam Gen: NAD, alert, cooperative with exam,  ENT: normal lips, normal nasal mucosa,  Eye: normal EOM, normal conjunctiva and lids CV:  no edema, +2 pedal pulses, regular rate and rhythm, S1-S2   Resp: no accessory muscle use, non-labored, clear to auscultation GI: no masses, some tenderness in the lower quadrant, positive bowel sounds, soft, nondistended, no hernia  Skin: no rashes, no areas of induration  Neuro: normal tone, normal sensation to touch Psych:  normal insight, alert and oriented MSK: Normal gait, normal strength      ASSESSMENT & PLAN:   Lower abdominal pain Symptoms are suggestive of a viral gastroenteritis. He seems to be improving based on some  of his symptoms today. - Continue supportive care. Can try Imodium and Pepto-Bismol - CBC and therapy. If elevated will suggest diverticulitis and would try antibiotics - Given indications to return.

## 2017-07-28 DIAGNOSIS — N186 End stage renal disease: Secondary | ICD-10-CM | POA: Diagnosis not present

## 2017-07-28 DIAGNOSIS — Z23 Encounter for immunization: Secondary | ICD-10-CM | POA: Diagnosis not present

## 2017-07-28 DIAGNOSIS — N2581 Secondary hyperparathyroidism of renal origin: Secondary | ICD-10-CM | POA: Diagnosis not present

## 2017-07-28 DIAGNOSIS — D631 Anemia in chronic kidney disease: Secondary | ICD-10-CM | POA: Diagnosis not present

## 2017-07-28 DIAGNOSIS — D509 Iron deficiency anemia, unspecified: Secondary | ICD-10-CM | POA: Diagnosis not present

## 2017-07-30 DIAGNOSIS — N186 End stage renal disease: Secondary | ICD-10-CM | POA: Diagnosis not present

## 2017-07-30 DIAGNOSIS — N2581 Secondary hyperparathyroidism of renal origin: Secondary | ICD-10-CM | POA: Diagnosis not present

## 2017-07-30 DIAGNOSIS — D509 Iron deficiency anemia, unspecified: Secondary | ICD-10-CM | POA: Diagnosis not present

## 2017-07-30 DIAGNOSIS — Z23 Encounter for immunization: Secondary | ICD-10-CM | POA: Diagnosis not present

## 2017-07-30 DIAGNOSIS — D631 Anemia in chronic kidney disease: Secondary | ICD-10-CM | POA: Diagnosis not present

## 2017-08-01 DIAGNOSIS — D509 Iron deficiency anemia, unspecified: Secondary | ICD-10-CM | POA: Diagnosis not present

## 2017-08-01 DIAGNOSIS — N2581 Secondary hyperparathyroidism of renal origin: Secondary | ICD-10-CM | POA: Diagnosis not present

## 2017-08-01 DIAGNOSIS — N186 End stage renal disease: Secondary | ICD-10-CM | POA: Diagnosis not present

## 2017-08-01 DIAGNOSIS — D631 Anemia in chronic kidney disease: Secondary | ICD-10-CM | POA: Diagnosis not present

## 2017-08-01 DIAGNOSIS — Z23 Encounter for immunization: Secondary | ICD-10-CM | POA: Diagnosis not present

## 2017-08-04 DIAGNOSIS — Z23 Encounter for immunization: Secondary | ICD-10-CM | POA: Diagnosis not present

## 2017-08-04 DIAGNOSIS — D631 Anemia in chronic kidney disease: Secondary | ICD-10-CM | POA: Diagnosis not present

## 2017-08-04 DIAGNOSIS — N186 End stage renal disease: Secondary | ICD-10-CM | POA: Diagnosis not present

## 2017-08-04 DIAGNOSIS — D509 Iron deficiency anemia, unspecified: Secondary | ICD-10-CM | POA: Diagnosis not present

## 2017-08-04 DIAGNOSIS — N2581 Secondary hyperparathyroidism of renal origin: Secondary | ICD-10-CM | POA: Diagnosis not present

## 2017-08-06 DIAGNOSIS — N186 End stage renal disease: Secondary | ICD-10-CM | POA: Diagnosis not present

## 2017-08-06 DIAGNOSIS — N2581 Secondary hyperparathyroidism of renal origin: Secondary | ICD-10-CM | POA: Diagnosis not present

## 2017-08-06 DIAGNOSIS — D509 Iron deficiency anemia, unspecified: Secondary | ICD-10-CM | POA: Diagnosis not present

## 2017-08-06 DIAGNOSIS — Z23 Encounter for immunization: Secondary | ICD-10-CM | POA: Diagnosis not present

## 2017-08-06 DIAGNOSIS — D631 Anemia in chronic kidney disease: Secondary | ICD-10-CM | POA: Diagnosis not present

## 2017-08-08 DIAGNOSIS — D509 Iron deficiency anemia, unspecified: Secondary | ICD-10-CM | POA: Diagnosis not present

## 2017-08-08 DIAGNOSIS — N186 End stage renal disease: Secondary | ICD-10-CM | POA: Diagnosis not present

## 2017-08-08 DIAGNOSIS — Z23 Encounter for immunization: Secondary | ICD-10-CM | POA: Diagnosis not present

## 2017-08-08 DIAGNOSIS — D631 Anemia in chronic kidney disease: Secondary | ICD-10-CM | POA: Diagnosis not present

## 2017-08-08 DIAGNOSIS — N2581 Secondary hyperparathyroidism of renal origin: Secondary | ICD-10-CM | POA: Diagnosis not present

## 2017-08-11 DIAGNOSIS — D509 Iron deficiency anemia, unspecified: Secondary | ICD-10-CM | POA: Diagnosis not present

## 2017-08-11 DIAGNOSIS — Z23 Encounter for immunization: Secondary | ICD-10-CM | POA: Diagnosis not present

## 2017-08-11 DIAGNOSIS — N186 End stage renal disease: Secondary | ICD-10-CM | POA: Diagnosis not present

## 2017-08-11 DIAGNOSIS — D631 Anemia in chronic kidney disease: Secondary | ICD-10-CM | POA: Diagnosis not present

## 2017-08-11 DIAGNOSIS — N2581 Secondary hyperparathyroidism of renal origin: Secondary | ICD-10-CM | POA: Diagnosis not present

## 2017-08-13 DIAGNOSIS — D631 Anemia in chronic kidney disease: Secondary | ICD-10-CM | POA: Diagnosis not present

## 2017-08-13 DIAGNOSIS — N2581 Secondary hyperparathyroidism of renal origin: Secondary | ICD-10-CM | POA: Diagnosis not present

## 2017-08-13 DIAGNOSIS — Z23 Encounter for immunization: Secondary | ICD-10-CM | POA: Diagnosis not present

## 2017-08-13 DIAGNOSIS — N186 End stage renal disease: Secondary | ICD-10-CM | POA: Diagnosis not present

## 2017-08-13 DIAGNOSIS — D509 Iron deficiency anemia, unspecified: Secondary | ICD-10-CM | POA: Diagnosis not present

## 2017-08-14 DIAGNOSIS — R399 Unspecified symptoms and signs involving the genitourinary system: Secondary | ICD-10-CM | POA: Diagnosis not present

## 2017-08-15 DIAGNOSIS — D631 Anemia in chronic kidney disease: Secondary | ICD-10-CM | POA: Diagnosis not present

## 2017-08-15 DIAGNOSIS — D509 Iron deficiency anemia, unspecified: Secondary | ICD-10-CM | POA: Diagnosis not present

## 2017-08-15 DIAGNOSIS — Z23 Encounter for immunization: Secondary | ICD-10-CM | POA: Diagnosis not present

## 2017-08-15 DIAGNOSIS — N2581 Secondary hyperparathyroidism of renal origin: Secondary | ICD-10-CM | POA: Diagnosis not present

## 2017-08-15 DIAGNOSIS — N186 End stage renal disease: Secondary | ICD-10-CM | POA: Diagnosis not present

## 2017-08-18 DIAGNOSIS — D631 Anemia in chronic kidney disease: Secondary | ICD-10-CM | POA: Diagnosis not present

## 2017-08-18 DIAGNOSIS — N186 End stage renal disease: Secondary | ICD-10-CM | POA: Diagnosis not present

## 2017-08-18 DIAGNOSIS — N2581 Secondary hyperparathyroidism of renal origin: Secondary | ICD-10-CM | POA: Diagnosis not present

## 2017-08-18 DIAGNOSIS — Z23 Encounter for immunization: Secondary | ICD-10-CM | POA: Diagnosis not present

## 2017-08-18 DIAGNOSIS — D509 Iron deficiency anemia, unspecified: Secondary | ICD-10-CM | POA: Diagnosis not present

## 2017-08-20 DIAGNOSIS — N2581 Secondary hyperparathyroidism of renal origin: Secondary | ICD-10-CM | POA: Diagnosis not present

## 2017-08-20 DIAGNOSIS — N186 End stage renal disease: Secondary | ICD-10-CM | POA: Diagnosis not present

## 2017-08-20 DIAGNOSIS — Z23 Encounter for immunization: Secondary | ICD-10-CM | POA: Diagnosis not present

## 2017-08-20 DIAGNOSIS — D509 Iron deficiency anemia, unspecified: Secondary | ICD-10-CM | POA: Diagnosis not present

## 2017-08-20 DIAGNOSIS — D631 Anemia in chronic kidney disease: Secondary | ICD-10-CM | POA: Diagnosis not present

## 2017-08-22 DIAGNOSIS — N2581 Secondary hyperparathyroidism of renal origin: Secondary | ICD-10-CM | POA: Diagnosis not present

## 2017-08-22 DIAGNOSIS — N186 End stage renal disease: Secondary | ICD-10-CM | POA: Diagnosis not present

## 2017-08-22 DIAGNOSIS — D509 Iron deficiency anemia, unspecified: Secondary | ICD-10-CM | POA: Diagnosis not present

## 2017-08-22 DIAGNOSIS — D631 Anemia in chronic kidney disease: Secondary | ICD-10-CM | POA: Diagnosis not present

## 2017-08-22 DIAGNOSIS — Z23 Encounter for immunization: Secondary | ICD-10-CM | POA: Diagnosis not present

## 2017-08-25 DIAGNOSIS — Z992 Dependence on renal dialysis: Secondary | ICD-10-CM | POA: Diagnosis not present

## 2017-08-25 DIAGNOSIS — I10 Essential (primary) hypertension: Secondary | ICD-10-CM | POA: Diagnosis not present

## 2017-08-25 DIAGNOSIS — D509 Iron deficiency anemia, unspecified: Secondary | ICD-10-CM | POA: Diagnosis not present

## 2017-08-25 DIAGNOSIS — R05 Cough: Secondary | ICD-10-CM | POA: Diagnosis not present

## 2017-08-25 DIAGNOSIS — J188 Other pneumonia, unspecified organism: Secondary | ICD-10-CM | POA: Diagnosis not present

## 2017-08-25 DIAGNOSIS — Z23 Encounter for immunization: Secondary | ICD-10-CM | POA: Diagnosis not present

## 2017-08-25 DIAGNOSIS — N2581 Secondary hyperparathyroidism of renal origin: Secondary | ICD-10-CM | POA: Diagnosis not present

## 2017-08-25 DIAGNOSIS — D631 Anemia in chronic kidney disease: Secondary | ICD-10-CM | POA: Diagnosis not present

## 2017-08-25 DIAGNOSIS — N186 End stage renal disease: Secondary | ICD-10-CM | POA: Diagnosis not present

## 2017-08-25 DIAGNOSIS — R0602 Shortness of breath: Secondary | ICD-10-CM | POA: Diagnosis not present

## 2017-08-26 DIAGNOSIS — Z992 Dependence on renal dialysis: Secondary | ICD-10-CM | POA: Diagnosis not present

## 2017-08-26 DIAGNOSIS — N186 End stage renal disease: Secondary | ICD-10-CM | POA: Diagnosis not present

## 2017-08-27 ENCOUNTER — Telehealth: Payer: Self-pay | Admitting: Internal Medicine

## 2017-08-27 DIAGNOSIS — R0902 Hypoxemia: Secondary | ICD-10-CM | POA: Diagnosis not present

## 2017-08-27 DIAGNOSIS — J9601 Acute respiratory failure with hypoxia: Secondary | ICD-10-CM | POA: Diagnosis not present

## 2017-08-27 DIAGNOSIS — R0600 Dyspnea, unspecified: Secondary | ICD-10-CM | POA: Diagnosis not present

## 2017-08-27 DIAGNOSIS — J90 Pleural effusion, not elsewhere classified: Secondary | ICD-10-CM | POA: Diagnosis not present

## 2017-08-27 DIAGNOSIS — M3214 Glomerular disease in systemic lupus erythematosus: Secondary | ICD-10-CM | POA: Diagnosis not present

## 2017-08-27 DIAGNOSIS — I5021 Acute systolic (congestive) heart failure: Secondary | ICD-10-CM | POA: Diagnosis not present

## 2017-08-27 DIAGNOSIS — J849 Interstitial pulmonary disease, unspecified: Secondary | ICD-10-CM | POA: Diagnosis not present

## 2017-08-27 DIAGNOSIS — N186 End stage renal disease: Secondary | ICD-10-CM | POA: Diagnosis not present

## 2017-08-27 DIAGNOSIS — D509 Iron deficiency anemia, unspecified: Secondary | ICD-10-CM | POA: Diagnosis not present

## 2017-08-27 DIAGNOSIS — R0602 Shortness of breath: Secondary | ICD-10-CM | POA: Diagnosis not present

## 2017-08-27 DIAGNOSIS — I10 Essential (primary) hypertension: Secondary | ICD-10-CM | POA: Diagnosis not present

## 2017-08-27 DIAGNOSIS — Z888 Allergy status to other drugs, medicaments and biological substances status: Secondary | ICD-10-CM | POA: Diagnosis not present

## 2017-08-27 DIAGNOSIS — R05 Cough: Secondary | ICD-10-CM | POA: Diagnosis not present

## 2017-08-27 DIAGNOSIS — I132 Hypertensive heart and chronic kidney disease with heart failure and with stage 5 chronic kidney disease, or end stage renal disease: Secondary | ICD-10-CM | POA: Diagnosis not present

## 2017-08-27 DIAGNOSIS — Z79899 Other long term (current) drug therapy: Secondary | ICD-10-CM | POA: Diagnosis not present

## 2017-08-27 DIAGNOSIS — J189 Pneumonia, unspecified organism: Secondary | ICD-10-CM | POA: Diagnosis not present

## 2017-08-27 DIAGNOSIS — R0989 Other specified symptoms and signs involving the circulatory and respiratory systems: Secondary | ICD-10-CM | POA: Diagnosis not present

## 2017-08-27 DIAGNOSIS — R590 Localized enlarged lymph nodes: Secondary | ICD-10-CM | POA: Diagnosis not present

## 2017-08-27 DIAGNOSIS — Z7682 Awaiting organ transplant status: Secondary | ICD-10-CM | POA: Diagnosis not present

## 2017-08-27 DIAGNOSIS — I519 Heart disease, unspecified: Secondary | ICD-10-CM | POA: Diagnosis not present

## 2017-08-27 DIAGNOSIS — I517 Cardiomegaly: Secondary | ICD-10-CM | POA: Diagnosis not present

## 2017-08-27 DIAGNOSIS — N2581 Secondary hyperparathyroidism of renal origin: Secondary | ICD-10-CM | POA: Diagnosis not present

## 2017-08-27 DIAGNOSIS — I5189 Other ill-defined heart diseases: Secondary | ICD-10-CM | POA: Diagnosis not present

## 2017-08-27 DIAGNOSIS — D631 Anemia in chronic kidney disease: Secondary | ICD-10-CM | POA: Diagnosis not present

## 2017-08-27 DIAGNOSIS — J9811 Atelectasis: Secondary | ICD-10-CM | POA: Diagnosis not present

## 2017-08-27 DIAGNOSIS — Z992 Dependence on renal dialysis: Secondary | ICD-10-CM | POA: Diagnosis not present

## 2017-08-27 DIAGNOSIS — I081 Rheumatic disorders of both mitral and tricuspid valves: Secondary | ICD-10-CM | POA: Diagnosis not present

## 2017-08-27 DIAGNOSIS — R918 Other nonspecific abnormal finding of lung field: Secondary | ICD-10-CM | POA: Diagnosis not present

## 2017-08-27 NOTE — Telephone Encounter (Signed)
Pt wife called and said that pt has been [facility] [place]. He went in for shortness of breath

## 2017-08-28 NOTE — Telephone Encounter (Signed)
FYI

## 2017-08-28 NOTE — Telephone Encounter (Signed)
Noted. Thx.

## 2017-09-01 DIAGNOSIS — N186 End stage renal disease: Secondary | ICD-10-CM | POA: Diagnosis not present

## 2017-09-01 DIAGNOSIS — D509 Iron deficiency anemia, unspecified: Secondary | ICD-10-CM | POA: Diagnosis not present

## 2017-09-01 DIAGNOSIS — N2581 Secondary hyperparathyroidism of renal origin: Secondary | ICD-10-CM | POA: Diagnosis not present

## 2017-09-01 DIAGNOSIS — D631 Anemia in chronic kidney disease: Secondary | ICD-10-CM | POA: Diagnosis not present

## 2017-09-03 DIAGNOSIS — N2581 Secondary hyperparathyroidism of renal origin: Secondary | ICD-10-CM | POA: Diagnosis not present

## 2017-09-03 DIAGNOSIS — D631 Anemia in chronic kidney disease: Secondary | ICD-10-CM | POA: Diagnosis not present

## 2017-09-03 DIAGNOSIS — D509 Iron deficiency anemia, unspecified: Secondary | ICD-10-CM | POA: Diagnosis not present

## 2017-09-03 DIAGNOSIS — N186 End stage renal disease: Secondary | ICD-10-CM | POA: Diagnosis not present

## 2017-09-05 DIAGNOSIS — N186 End stage renal disease: Secondary | ICD-10-CM | POA: Diagnosis not present

## 2017-09-05 DIAGNOSIS — N2581 Secondary hyperparathyroidism of renal origin: Secondary | ICD-10-CM | POA: Diagnosis not present

## 2017-09-05 DIAGNOSIS — D631 Anemia in chronic kidney disease: Secondary | ICD-10-CM | POA: Diagnosis not present

## 2017-09-05 DIAGNOSIS — D509 Iron deficiency anemia, unspecified: Secondary | ICD-10-CM | POA: Diagnosis not present

## 2017-09-08 DIAGNOSIS — D631 Anemia in chronic kidney disease: Secondary | ICD-10-CM | POA: Diagnosis not present

## 2017-09-08 DIAGNOSIS — N186 End stage renal disease: Secondary | ICD-10-CM | POA: Diagnosis not present

## 2017-09-08 DIAGNOSIS — D509 Iron deficiency anemia, unspecified: Secondary | ICD-10-CM | POA: Diagnosis not present

## 2017-09-08 DIAGNOSIS — N2581 Secondary hyperparathyroidism of renal origin: Secondary | ICD-10-CM | POA: Diagnosis not present

## 2017-09-10 DIAGNOSIS — D509 Iron deficiency anemia, unspecified: Secondary | ICD-10-CM | POA: Diagnosis not present

## 2017-09-10 DIAGNOSIS — N186 End stage renal disease: Secondary | ICD-10-CM | POA: Diagnosis not present

## 2017-09-10 DIAGNOSIS — N2581 Secondary hyperparathyroidism of renal origin: Secondary | ICD-10-CM | POA: Diagnosis not present

## 2017-09-10 DIAGNOSIS — D631 Anemia in chronic kidney disease: Secondary | ICD-10-CM | POA: Diagnosis not present

## 2017-09-14 DIAGNOSIS — D509 Iron deficiency anemia, unspecified: Secondary | ICD-10-CM | POA: Diagnosis not present

## 2017-09-14 DIAGNOSIS — N2581 Secondary hyperparathyroidism of renal origin: Secondary | ICD-10-CM | POA: Diagnosis not present

## 2017-09-14 DIAGNOSIS — N186 End stage renal disease: Secondary | ICD-10-CM | POA: Diagnosis not present

## 2017-09-14 DIAGNOSIS — D631 Anemia in chronic kidney disease: Secondary | ICD-10-CM | POA: Diagnosis not present

## 2017-09-16 DIAGNOSIS — N186 End stage renal disease: Secondary | ICD-10-CM | POA: Diagnosis not present

## 2017-09-16 DIAGNOSIS — N2581 Secondary hyperparathyroidism of renal origin: Secondary | ICD-10-CM | POA: Diagnosis not present

## 2017-09-16 DIAGNOSIS — D631 Anemia in chronic kidney disease: Secondary | ICD-10-CM | POA: Diagnosis not present

## 2017-09-16 DIAGNOSIS — D509 Iron deficiency anemia, unspecified: Secondary | ICD-10-CM | POA: Diagnosis not present

## 2017-09-19 DIAGNOSIS — N2581 Secondary hyperparathyroidism of renal origin: Secondary | ICD-10-CM | POA: Diagnosis not present

## 2017-09-19 DIAGNOSIS — D631 Anemia in chronic kidney disease: Secondary | ICD-10-CM | POA: Diagnosis not present

## 2017-09-19 DIAGNOSIS — D509 Iron deficiency anemia, unspecified: Secondary | ICD-10-CM | POA: Diagnosis not present

## 2017-09-19 DIAGNOSIS — N186 End stage renal disease: Secondary | ICD-10-CM | POA: Diagnosis not present

## 2017-09-22 DIAGNOSIS — N2581 Secondary hyperparathyroidism of renal origin: Secondary | ICD-10-CM | POA: Diagnosis not present

## 2017-09-22 DIAGNOSIS — D509 Iron deficiency anemia, unspecified: Secondary | ICD-10-CM | POA: Diagnosis not present

## 2017-09-22 DIAGNOSIS — D631 Anemia in chronic kidney disease: Secondary | ICD-10-CM | POA: Diagnosis not present

## 2017-09-22 DIAGNOSIS — N186 End stage renal disease: Secondary | ICD-10-CM | POA: Diagnosis not present

## 2017-09-24 DIAGNOSIS — N186 End stage renal disease: Secondary | ICD-10-CM | POA: Diagnosis not present

## 2017-09-24 DIAGNOSIS — N2581 Secondary hyperparathyroidism of renal origin: Secondary | ICD-10-CM | POA: Diagnosis not present

## 2017-09-24 DIAGNOSIS — D509 Iron deficiency anemia, unspecified: Secondary | ICD-10-CM | POA: Diagnosis not present

## 2017-09-24 DIAGNOSIS — D631 Anemia in chronic kidney disease: Secondary | ICD-10-CM | POA: Diagnosis not present

## 2017-09-25 DIAGNOSIS — Z992 Dependence on renal dialysis: Secondary | ICD-10-CM | POA: Diagnosis not present

## 2017-09-25 DIAGNOSIS — N186 End stage renal disease: Secondary | ICD-10-CM | POA: Diagnosis not present

## 2017-09-26 DIAGNOSIS — N2581 Secondary hyperparathyroidism of renal origin: Secondary | ICD-10-CM | POA: Diagnosis not present

## 2017-09-26 DIAGNOSIS — N186 End stage renal disease: Secondary | ICD-10-CM | POA: Diagnosis not present

## 2017-09-26 DIAGNOSIS — D509 Iron deficiency anemia, unspecified: Secondary | ICD-10-CM | POA: Diagnosis not present

## 2017-09-26 DIAGNOSIS — D631 Anemia in chronic kidney disease: Secondary | ICD-10-CM | POA: Diagnosis not present

## 2017-09-29 DIAGNOSIS — D509 Iron deficiency anemia, unspecified: Secondary | ICD-10-CM | POA: Diagnosis not present

## 2017-09-29 DIAGNOSIS — N2581 Secondary hyperparathyroidism of renal origin: Secondary | ICD-10-CM | POA: Diagnosis not present

## 2017-09-29 DIAGNOSIS — N186 End stage renal disease: Secondary | ICD-10-CM | POA: Diagnosis not present

## 2017-09-29 DIAGNOSIS — D631 Anemia in chronic kidney disease: Secondary | ICD-10-CM | POA: Diagnosis not present

## 2017-10-01 DIAGNOSIS — D631 Anemia in chronic kidney disease: Secondary | ICD-10-CM | POA: Diagnosis not present

## 2017-10-01 DIAGNOSIS — N2581 Secondary hyperparathyroidism of renal origin: Secondary | ICD-10-CM | POA: Diagnosis not present

## 2017-10-01 DIAGNOSIS — D509 Iron deficiency anemia, unspecified: Secondary | ICD-10-CM | POA: Diagnosis not present

## 2017-10-01 DIAGNOSIS — N186 End stage renal disease: Secondary | ICD-10-CM | POA: Diagnosis not present

## 2017-10-03 DIAGNOSIS — D631 Anemia in chronic kidney disease: Secondary | ICD-10-CM | POA: Diagnosis not present

## 2017-10-03 DIAGNOSIS — D509 Iron deficiency anemia, unspecified: Secondary | ICD-10-CM | POA: Diagnosis not present

## 2017-10-03 DIAGNOSIS — N2581 Secondary hyperparathyroidism of renal origin: Secondary | ICD-10-CM | POA: Diagnosis not present

## 2017-10-03 DIAGNOSIS — N186 End stage renal disease: Secondary | ICD-10-CM | POA: Diagnosis not present

## 2017-10-06 DIAGNOSIS — D631 Anemia in chronic kidney disease: Secondary | ICD-10-CM | POA: Diagnosis not present

## 2017-10-06 DIAGNOSIS — N2581 Secondary hyperparathyroidism of renal origin: Secondary | ICD-10-CM | POA: Diagnosis not present

## 2017-10-06 DIAGNOSIS — D509 Iron deficiency anemia, unspecified: Secondary | ICD-10-CM | POA: Diagnosis not present

## 2017-10-06 DIAGNOSIS — N186 End stage renal disease: Secondary | ICD-10-CM | POA: Diagnosis not present

## 2017-10-08 DIAGNOSIS — D509 Iron deficiency anemia, unspecified: Secondary | ICD-10-CM | POA: Diagnosis not present

## 2017-10-08 DIAGNOSIS — D631 Anemia in chronic kidney disease: Secondary | ICD-10-CM | POA: Diagnosis not present

## 2017-10-08 DIAGNOSIS — N186 End stage renal disease: Secondary | ICD-10-CM | POA: Diagnosis not present

## 2017-10-08 DIAGNOSIS — N2581 Secondary hyperparathyroidism of renal origin: Secondary | ICD-10-CM | POA: Diagnosis not present

## 2017-10-10 DIAGNOSIS — N2581 Secondary hyperparathyroidism of renal origin: Secondary | ICD-10-CM | POA: Diagnosis not present

## 2017-10-10 DIAGNOSIS — D509 Iron deficiency anemia, unspecified: Secondary | ICD-10-CM | POA: Diagnosis not present

## 2017-10-10 DIAGNOSIS — N186 End stage renal disease: Secondary | ICD-10-CM | POA: Diagnosis not present

## 2017-10-10 DIAGNOSIS — D631 Anemia in chronic kidney disease: Secondary | ICD-10-CM | POA: Diagnosis not present

## 2017-10-13 DIAGNOSIS — N186 End stage renal disease: Secondary | ICD-10-CM | POA: Diagnosis not present

## 2017-10-13 DIAGNOSIS — D509 Iron deficiency anemia, unspecified: Secondary | ICD-10-CM | POA: Diagnosis not present

## 2017-10-13 DIAGNOSIS — D631 Anemia in chronic kidney disease: Secondary | ICD-10-CM | POA: Diagnosis not present

## 2017-10-13 DIAGNOSIS — N2581 Secondary hyperparathyroidism of renal origin: Secondary | ICD-10-CM | POA: Diagnosis not present

## 2017-10-14 DIAGNOSIS — I10 Essential (primary) hypertension: Secondary | ICD-10-CM | POA: Diagnosis not present

## 2017-10-14 DIAGNOSIS — I5022 Chronic systolic (congestive) heart failure: Secondary | ICD-10-CM | POA: Diagnosis not present

## 2017-10-14 DIAGNOSIS — R0600 Dyspnea, unspecified: Secondary | ICD-10-CM | POA: Diagnosis not present

## 2017-10-15 DIAGNOSIS — D509 Iron deficiency anemia, unspecified: Secondary | ICD-10-CM | POA: Diagnosis not present

## 2017-10-15 DIAGNOSIS — N186 End stage renal disease: Secondary | ICD-10-CM | POA: Diagnosis not present

## 2017-10-15 DIAGNOSIS — D631 Anemia in chronic kidney disease: Secondary | ICD-10-CM | POA: Diagnosis not present

## 2017-10-15 DIAGNOSIS — N2581 Secondary hyperparathyroidism of renal origin: Secondary | ICD-10-CM | POA: Diagnosis not present

## 2017-10-17 DIAGNOSIS — N186 End stage renal disease: Secondary | ICD-10-CM | POA: Diagnosis not present

## 2017-10-17 DIAGNOSIS — N2581 Secondary hyperparathyroidism of renal origin: Secondary | ICD-10-CM | POA: Diagnosis not present

## 2017-10-17 DIAGNOSIS — D509 Iron deficiency anemia, unspecified: Secondary | ICD-10-CM | POA: Diagnosis not present

## 2017-10-17 DIAGNOSIS — D631 Anemia in chronic kidney disease: Secondary | ICD-10-CM | POA: Diagnosis not present

## 2017-10-19 DIAGNOSIS — N2581 Secondary hyperparathyroidism of renal origin: Secondary | ICD-10-CM | POA: Diagnosis not present

## 2017-10-19 DIAGNOSIS — D509 Iron deficiency anemia, unspecified: Secondary | ICD-10-CM | POA: Diagnosis not present

## 2017-10-19 DIAGNOSIS — N186 End stage renal disease: Secondary | ICD-10-CM | POA: Diagnosis not present

## 2017-10-19 DIAGNOSIS — D631 Anemia in chronic kidney disease: Secondary | ICD-10-CM | POA: Diagnosis not present

## 2017-10-22 DIAGNOSIS — N2581 Secondary hyperparathyroidism of renal origin: Secondary | ICD-10-CM | POA: Diagnosis not present

## 2017-10-22 DIAGNOSIS — D509 Iron deficiency anemia, unspecified: Secondary | ICD-10-CM | POA: Diagnosis not present

## 2017-10-22 DIAGNOSIS — D631 Anemia in chronic kidney disease: Secondary | ICD-10-CM | POA: Diagnosis not present

## 2017-10-22 DIAGNOSIS — N186 End stage renal disease: Secondary | ICD-10-CM | POA: Diagnosis not present

## 2017-10-24 DIAGNOSIS — D631 Anemia in chronic kidney disease: Secondary | ICD-10-CM | POA: Diagnosis not present

## 2017-10-24 DIAGNOSIS — N2581 Secondary hyperparathyroidism of renal origin: Secondary | ICD-10-CM | POA: Diagnosis not present

## 2017-10-24 DIAGNOSIS — D509 Iron deficiency anemia, unspecified: Secondary | ICD-10-CM | POA: Diagnosis not present

## 2017-10-24 DIAGNOSIS — N186 End stage renal disease: Secondary | ICD-10-CM | POA: Diagnosis not present

## 2017-10-26 DIAGNOSIS — N186 End stage renal disease: Secondary | ICD-10-CM | POA: Diagnosis not present

## 2017-10-26 DIAGNOSIS — Z992 Dependence on renal dialysis: Secondary | ICD-10-CM | POA: Diagnosis not present

## 2017-10-27 DIAGNOSIS — D631 Anemia in chronic kidney disease: Secondary | ICD-10-CM | POA: Diagnosis not present

## 2017-10-27 DIAGNOSIS — N2581 Secondary hyperparathyroidism of renal origin: Secondary | ICD-10-CM | POA: Diagnosis not present

## 2017-10-27 DIAGNOSIS — D509 Iron deficiency anemia, unspecified: Secondary | ICD-10-CM | POA: Diagnosis not present

## 2017-10-27 DIAGNOSIS — N186 End stage renal disease: Secondary | ICD-10-CM | POA: Diagnosis not present

## 2017-10-29 DIAGNOSIS — D631 Anemia in chronic kidney disease: Secondary | ICD-10-CM | POA: Diagnosis not present

## 2017-10-29 DIAGNOSIS — N2581 Secondary hyperparathyroidism of renal origin: Secondary | ICD-10-CM | POA: Diagnosis not present

## 2017-10-29 DIAGNOSIS — N186 End stage renal disease: Secondary | ICD-10-CM | POA: Diagnosis not present

## 2017-10-29 DIAGNOSIS — D509 Iron deficiency anemia, unspecified: Secondary | ICD-10-CM | POA: Diagnosis not present

## 2017-10-31 DIAGNOSIS — D631 Anemia in chronic kidney disease: Secondary | ICD-10-CM | POA: Diagnosis not present

## 2017-10-31 DIAGNOSIS — N2581 Secondary hyperparathyroidism of renal origin: Secondary | ICD-10-CM | POA: Diagnosis not present

## 2017-10-31 DIAGNOSIS — D509 Iron deficiency anemia, unspecified: Secondary | ICD-10-CM | POA: Diagnosis not present

## 2017-10-31 DIAGNOSIS — N186 End stage renal disease: Secondary | ICD-10-CM | POA: Diagnosis not present

## 2017-11-03 DIAGNOSIS — N186 End stage renal disease: Secondary | ICD-10-CM | POA: Diagnosis not present

## 2017-11-03 DIAGNOSIS — D509 Iron deficiency anemia, unspecified: Secondary | ICD-10-CM | POA: Diagnosis not present

## 2017-11-03 DIAGNOSIS — N2581 Secondary hyperparathyroidism of renal origin: Secondary | ICD-10-CM | POA: Diagnosis not present

## 2017-11-03 DIAGNOSIS — D631 Anemia in chronic kidney disease: Secondary | ICD-10-CM | POA: Diagnosis not present

## 2017-11-05 DIAGNOSIS — D509 Iron deficiency anemia, unspecified: Secondary | ICD-10-CM | POA: Diagnosis not present

## 2017-11-05 DIAGNOSIS — N186 End stage renal disease: Secondary | ICD-10-CM | POA: Diagnosis not present

## 2017-11-05 DIAGNOSIS — N2581 Secondary hyperparathyroidism of renal origin: Secondary | ICD-10-CM | POA: Diagnosis not present

## 2017-11-05 DIAGNOSIS — D631 Anemia in chronic kidney disease: Secondary | ICD-10-CM | POA: Diagnosis not present

## 2017-11-07 DIAGNOSIS — N2581 Secondary hyperparathyroidism of renal origin: Secondary | ICD-10-CM | POA: Diagnosis not present

## 2017-11-07 DIAGNOSIS — D631 Anemia in chronic kidney disease: Secondary | ICD-10-CM | POA: Diagnosis not present

## 2017-11-07 DIAGNOSIS — D509 Iron deficiency anemia, unspecified: Secondary | ICD-10-CM | POA: Diagnosis not present

## 2017-11-07 DIAGNOSIS — N186 End stage renal disease: Secondary | ICD-10-CM | POA: Diagnosis not present

## 2017-11-10 DIAGNOSIS — D509 Iron deficiency anemia, unspecified: Secondary | ICD-10-CM | POA: Diagnosis not present

## 2017-11-10 DIAGNOSIS — N2581 Secondary hyperparathyroidism of renal origin: Secondary | ICD-10-CM | POA: Diagnosis not present

## 2017-11-10 DIAGNOSIS — D631 Anemia in chronic kidney disease: Secondary | ICD-10-CM | POA: Diagnosis not present

## 2017-11-10 DIAGNOSIS — N186 End stage renal disease: Secondary | ICD-10-CM | POA: Diagnosis not present

## 2017-11-11 ENCOUNTER — Ambulatory Visit: Payer: Medicare Other | Admitting: Internal Medicine

## 2017-11-12 DIAGNOSIS — D631 Anemia in chronic kidney disease: Secondary | ICD-10-CM | POA: Diagnosis not present

## 2017-11-12 DIAGNOSIS — N2581 Secondary hyperparathyroidism of renal origin: Secondary | ICD-10-CM | POA: Diagnosis not present

## 2017-11-12 DIAGNOSIS — N186 End stage renal disease: Secondary | ICD-10-CM | POA: Diagnosis not present

## 2017-11-12 DIAGNOSIS — D509 Iron deficiency anemia, unspecified: Secondary | ICD-10-CM | POA: Diagnosis not present

## 2017-11-14 DIAGNOSIS — N186 End stage renal disease: Secondary | ICD-10-CM | POA: Diagnosis not present

## 2017-11-17 DIAGNOSIS — N186 End stage renal disease: Secondary | ICD-10-CM | POA: Diagnosis not present

## 2017-11-17 DIAGNOSIS — N2581 Secondary hyperparathyroidism of renal origin: Secondary | ICD-10-CM | POA: Diagnosis not present

## 2017-11-17 DIAGNOSIS — D631 Anemia in chronic kidney disease: Secondary | ICD-10-CM | POA: Diagnosis not present

## 2017-11-17 DIAGNOSIS — D509 Iron deficiency anemia, unspecified: Secondary | ICD-10-CM | POA: Diagnosis not present

## 2017-11-19 DIAGNOSIS — N2581 Secondary hyperparathyroidism of renal origin: Secondary | ICD-10-CM | POA: Diagnosis not present

## 2017-11-19 DIAGNOSIS — D509 Iron deficiency anemia, unspecified: Secondary | ICD-10-CM | POA: Diagnosis not present

## 2017-11-19 DIAGNOSIS — N186 End stage renal disease: Secondary | ICD-10-CM | POA: Diagnosis not present

## 2017-11-19 DIAGNOSIS — D631 Anemia in chronic kidney disease: Secondary | ICD-10-CM | POA: Diagnosis not present

## 2017-11-21 DIAGNOSIS — D631 Anemia in chronic kidney disease: Secondary | ICD-10-CM | POA: Diagnosis not present

## 2017-11-21 DIAGNOSIS — D509 Iron deficiency anemia, unspecified: Secondary | ICD-10-CM | POA: Diagnosis not present

## 2017-11-21 DIAGNOSIS — N2581 Secondary hyperparathyroidism of renal origin: Secondary | ICD-10-CM | POA: Diagnosis not present

## 2017-11-21 DIAGNOSIS — N186 End stage renal disease: Secondary | ICD-10-CM | POA: Diagnosis not present

## 2017-11-24 DIAGNOSIS — N186 End stage renal disease: Secondary | ICD-10-CM | POA: Diagnosis not present

## 2017-11-24 DIAGNOSIS — D631 Anemia in chronic kidney disease: Secondary | ICD-10-CM | POA: Diagnosis not present

## 2017-11-24 DIAGNOSIS — D509 Iron deficiency anemia, unspecified: Secondary | ICD-10-CM | POA: Diagnosis not present

## 2017-11-24 DIAGNOSIS — N2581 Secondary hyperparathyroidism of renal origin: Secondary | ICD-10-CM | POA: Diagnosis not present

## 2017-11-26 DIAGNOSIS — D631 Anemia in chronic kidney disease: Secondary | ICD-10-CM | POA: Diagnosis not present

## 2017-11-26 DIAGNOSIS — Z992 Dependence on renal dialysis: Secondary | ICD-10-CM | POA: Diagnosis not present

## 2017-11-26 DIAGNOSIS — N2581 Secondary hyperparathyroidism of renal origin: Secondary | ICD-10-CM | POA: Diagnosis not present

## 2017-11-26 DIAGNOSIS — D509 Iron deficiency anemia, unspecified: Secondary | ICD-10-CM | POA: Diagnosis not present

## 2017-11-26 DIAGNOSIS — N186 End stage renal disease: Secondary | ICD-10-CM | POA: Diagnosis not present

## 2017-11-28 DIAGNOSIS — D509 Iron deficiency anemia, unspecified: Secondary | ICD-10-CM | POA: Diagnosis not present

## 2017-11-28 DIAGNOSIS — D631 Anemia in chronic kidney disease: Secondary | ICD-10-CM | POA: Diagnosis not present

## 2017-11-28 DIAGNOSIS — N2581 Secondary hyperparathyroidism of renal origin: Secondary | ICD-10-CM | POA: Diagnosis not present

## 2017-11-28 DIAGNOSIS — N186 End stage renal disease: Secondary | ICD-10-CM | POA: Diagnosis not present

## 2017-12-01 DIAGNOSIS — D509 Iron deficiency anemia, unspecified: Secondary | ICD-10-CM | POA: Diagnosis not present

## 2017-12-01 DIAGNOSIS — D631 Anemia in chronic kidney disease: Secondary | ICD-10-CM | POA: Diagnosis not present

## 2017-12-01 DIAGNOSIS — N2581 Secondary hyperparathyroidism of renal origin: Secondary | ICD-10-CM | POA: Diagnosis not present

## 2017-12-01 DIAGNOSIS — N186 End stage renal disease: Secondary | ICD-10-CM | POA: Diagnosis not present

## 2017-12-03 DIAGNOSIS — N186 End stage renal disease: Secondary | ICD-10-CM | POA: Diagnosis not present

## 2017-12-03 DIAGNOSIS — D631 Anemia in chronic kidney disease: Secondary | ICD-10-CM | POA: Diagnosis not present

## 2017-12-03 DIAGNOSIS — D509 Iron deficiency anemia, unspecified: Secondary | ICD-10-CM | POA: Diagnosis not present

## 2017-12-03 DIAGNOSIS — N2581 Secondary hyperparathyroidism of renal origin: Secondary | ICD-10-CM | POA: Diagnosis not present

## 2017-12-05 DIAGNOSIS — N186 End stage renal disease: Secondary | ICD-10-CM | POA: Diagnosis not present

## 2017-12-05 DIAGNOSIS — N2581 Secondary hyperparathyroidism of renal origin: Secondary | ICD-10-CM | POA: Diagnosis not present

## 2017-12-05 DIAGNOSIS — D509 Iron deficiency anemia, unspecified: Secondary | ICD-10-CM | POA: Diagnosis not present

## 2017-12-05 DIAGNOSIS — D631 Anemia in chronic kidney disease: Secondary | ICD-10-CM | POA: Diagnosis not present

## 2017-12-08 DIAGNOSIS — D509 Iron deficiency anemia, unspecified: Secondary | ICD-10-CM | POA: Diagnosis not present

## 2017-12-08 DIAGNOSIS — D631 Anemia in chronic kidney disease: Secondary | ICD-10-CM | POA: Diagnosis not present

## 2017-12-08 DIAGNOSIS — N2581 Secondary hyperparathyroidism of renal origin: Secondary | ICD-10-CM | POA: Diagnosis not present

## 2017-12-08 DIAGNOSIS — N186 End stage renal disease: Secondary | ICD-10-CM | POA: Diagnosis not present

## 2017-12-10 ENCOUNTER — Telehealth: Payer: Self-pay | Admitting: Internal Medicine

## 2017-12-10 DIAGNOSIS — N2581 Secondary hyperparathyroidism of renal origin: Secondary | ICD-10-CM | POA: Diagnosis not present

## 2017-12-10 DIAGNOSIS — N186 End stage renal disease: Secondary | ICD-10-CM | POA: Diagnosis not present

## 2017-12-10 DIAGNOSIS — D509 Iron deficiency anemia, unspecified: Secondary | ICD-10-CM | POA: Diagnosis not present

## 2017-12-10 DIAGNOSIS — D631 Anemia in chronic kidney disease: Secondary | ICD-10-CM | POA: Diagnosis not present

## 2017-12-10 NOTE — Telephone Encounter (Signed)
Erroneous encounter

## 2017-12-12 DIAGNOSIS — D631 Anemia in chronic kidney disease: Secondary | ICD-10-CM | POA: Diagnosis not present

## 2017-12-12 DIAGNOSIS — D509 Iron deficiency anemia, unspecified: Secondary | ICD-10-CM | POA: Diagnosis not present

## 2017-12-12 DIAGNOSIS — N2581 Secondary hyperparathyroidism of renal origin: Secondary | ICD-10-CM | POA: Diagnosis not present

## 2017-12-12 DIAGNOSIS — N186 End stage renal disease: Secondary | ICD-10-CM | POA: Diagnosis not present

## 2017-12-15 DIAGNOSIS — D631 Anemia in chronic kidney disease: Secondary | ICD-10-CM | POA: Diagnosis not present

## 2017-12-15 DIAGNOSIS — N186 End stage renal disease: Secondary | ICD-10-CM | POA: Diagnosis not present

## 2017-12-15 DIAGNOSIS — N2581 Secondary hyperparathyroidism of renal origin: Secondary | ICD-10-CM | POA: Diagnosis not present

## 2017-12-15 DIAGNOSIS — D509 Iron deficiency anemia, unspecified: Secondary | ICD-10-CM | POA: Diagnosis not present

## 2017-12-17 DIAGNOSIS — D631 Anemia in chronic kidney disease: Secondary | ICD-10-CM | POA: Diagnosis not present

## 2017-12-17 DIAGNOSIS — N186 End stage renal disease: Secondary | ICD-10-CM | POA: Diagnosis not present

## 2017-12-17 DIAGNOSIS — D509 Iron deficiency anemia, unspecified: Secondary | ICD-10-CM | POA: Diagnosis not present

## 2017-12-17 DIAGNOSIS — N2581 Secondary hyperparathyroidism of renal origin: Secondary | ICD-10-CM | POA: Diagnosis not present

## 2017-12-19 DIAGNOSIS — D509 Iron deficiency anemia, unspecified: Secondary | ICD-10-CM | POA: Diagnosis not present

## 2017-12-19 DIAGNOSIS — D631 Anemia in chronic kidney disease: Secondary | ICD-10-CM | POA: Diagnosis not present

## 2017-12-19 DIAGNOSIS — N2581 Secondary hyperparathyroidism of renal origin: Secondary | ICD-10-CM | POA: Diagnosis not present

## 2017-12-19 DIAGNOSIS — N186 End stage renal disease: Secondary | ICD-10-CM | POA: Diagnosis not present

## 2017-12-22 DIAGNOSIS — D509 Iron deficiency anemia, unspecified: Secondary | ICD-10-CM | POA: Diagnosis not present

## 2017-12-22 DIAGNOSIS — D631 Anemia in chronic kidney disease: Secondary | ICD-10-CM | POA: Diagnosis not present

## 2017-12-22 DIAGNOSIS — N2581 Secondary hyperparathyroidism of renal origin: Secondary | ICD-10-CM | POA: Diagnosis not present

## 2017-12-22 DIAGNOSIS — N186 End stage renal disease: Secondary | ICD-10-CM | POA: Diagnosis not present

## 2017-12-24 DIAGNOSIS — D631 Anemia in chronic kidney disease: Secondary | ICD-10-CM | POA: Diagnosis not present

## 2017-12-24 DIAGNOSIS — Z992 Dependence on renal dialysis: Secondary | ICD-10-CM | POA: Diagnosis not present

## 2017-12-24 DIAGNOSIS — N186 End stage renal disease: Secondary | ICD-10-CM | POA: Diagnosis not present

## 2017-12-24 DIAGNOSIS — N2581 Secondary hyperparathyroidism of renal origin: Secondary | ICD-10-CM | POA: Diagnosis not present

## 2017-12-24 DIAGNOSIS — D509 Iron deficiency anemia, unspecified: Secondary | ICD-10-CM | POA: Diagnosis not present

## 2017-12-25 DIAGNOSIS — N186 End stage renal disease: Secondary | ICD-10-CM | POA: Diagnosis not present

## 2017-12-25 DIAGNOSIS — Z992 Dependence on renal dialysis: Secondary | ICD-10-CM | POA: Diagnosis not present

## 2017-12-26 DIAGNOSIS — D509 Iron deficiency anemia, unspecified: Secondary | ICD-10-CM | POA: Diagnosis not present

## 2017-12-26 DIAGNOSIS — N2581 Secondary hyperparathyroidism of renal origin: Secondary | ICD-10-CM | POA: Diagnosis not present

## 2017-12-26 DIAGNOSIS — N186 End stage renal disease: Secondary | ICD-10-CM | POA: Diagnosis not present

## 2017-12-26 DIAGNOSIS — Z992 Dependence on renal dialysis: Secondary | ICD-10-CM | POA: Diagnosis not present

## 2017-12-27 DIAGNOSIS — N186 End stage renal disease: Secondary | ICD-10-CM | POA: Diagnosis not present

## 2017-12-27 DIAGNOSIS — Z992 Dependence on renal dialysis: Secondary | ICD-10-CM | POA: Diagnosis not present

## 2017-12-28 DIAGNOSIS — N186 End stage renal disease: Secondary | ICD-10-CM | POA: Diagnosis not present

## 2017-12-28 DIAGNOSIS — Z992 Dependence on renal dialysis: Secondary | ICD-10-CM | POA: Diagnosis not present

## 2017-12-29 DIAGNOSIS — Z992 Dependence on renal dialysis: Secondary | ICD-10-CM | POA: Diagnosis not present

## 2017-12-29 DIAGNOSIS — D509 Iron deficiency anemia, unspecified: Secondary | ICD-10-CM | POA: Diagnosis not present

## 2017-12-29 DIAGNOSIS — N2581 Secondary hyperparathyroidism of renal origin: Secondary | ICD-10-CM | POA: Diagnosis not present

## 2017-12-29 DIAGNOSIS — N186 End stage renal disease: Secondary | ICD-10-CM | POA: Diagnosis not present

## 2017-12-30 DIAGNOSIS — G4733 Obstructive sleep apnea (adult) (pediatric): Secondary | ICD-10-CM | POA: Diagnosis not present

## 2017-12-30 DIAGNOSIS — Z01818 Encounter for other preprocedural examination: Secondary | ICD-10-CM | POA: Diagnosis not present

## 2017-12-30 DIAGNOSIS — N186 End stage renal disease: Secondary | ICD-10-CM | POA: Diagnosis not present

## 2017-12-30 DIAGNOSIS — Z9989 Dependence on other enabling machines and devices: Secondary | ICD-10-CM | POA: Diagnosis not present

## 2017-12-30 DIAGNOSIS — Z992 Dependence on renal dialysis: Secondary | ICD-10-CM | POA: Diagnosis not present

## 2017-12-31 DIAGNOSIS — N2581 Secondary hyperparathyroidism of renal origin: Secondary | ICD-10-CM | POA: Diagnosis not present

## 2017-12-31 DIAGNOSIS — Z992 Dependence on renal dialysis: Secondary | ICD-10-CM | POA: Diagnosis not present

## 2017-12-31 DIAGNOSIS — D509 Iron deficiency anemia, unspecified: Secondary | ICD-10-CM | POA: Diagnosis not present

## 2017-12-31 DIAGNOSIS — N186 End stage renal disease: Secondary | ICD-10-CM | POA: Diagnosis not present

## 2018-01-01 DIAGNOSIS — Z992 Dependence on renal dialysis: Secondary | ICD-10-CM | POA: Diagnosis not present

## 2018-01-01 DIAGNOSIS — N186 End stage renal disease: Secondary | ICD-10-CM | POA: Diagnosis not present

## 2018-01-02 DIAGNOSIS — N2581 Secondary hyperparathyroidism of renal origin: Secondary | ICD-10-CM | POA: Diagnosis not present

## 2018-01-02 DIAGNOSIS — D509 Iron deficiency anemia, unspecified: Secondary | ICD-10-CM | POA: Diagnosis not present

## 2018-01-02 DIAGNOSIS — N186 End stage renal disease: Secondary | ICD-10-CM | POA: Diagnosis not present

## 2018-01-02 DIAGNOSIS — Z992 Dependence on renal dialysis: Secondary | ICD-10-CM | POA: Diagnosis not present

## 2018-01-03 DIAGNOSIS — N186 End stage renal disease: Secondary | ICD-10-CM | POA: Diagnosis not present

## 2018-01-03 DIAGNOSIS — Z992 Dependence on renal dialysis: Secondary | ICD-10-CM | POA: Diagnosis not present

## 2018-01-04 DIAGNOSIS — N186 End stage renal disease: Secondary | ICD-10-CM | POA: Diagnosis not present

## 2018-01-04 DIAGNOSIS — Z992 Dependence on renal dialysis: Secondary | ICD-10-CM | POA: Diagnosis not present

## 2018-01-05 DIAGNOSIS — N186 End stage renal disease: Secondary | ICD-10-CM | POA: Diagnosis not present

## 2018-01-05 DIAGNOSIS — N2581 Secondary hyperparathyroidism of renal origin: Secondary | ICD-10-CM | POA: Diagnosis not present

## 2018-01-05 DIAGNOSIS — D509 Iron deficiency anemia, unspecified: Secondary | ICD-10-CM | POA: Diagnosis not present

## 2018-01-05 DIAGNOSIS — Z992 Dependence on renal dialysis: Secondary | ICD-10-CM | POA: Diagnosis not present

## 2018-01-06 DIAGNOSIS — N186 End stage renal disease: Secondary | ICD-10-CM | POA: Diagnosis not present

## 2018-01-06 DIAGNOSIS — Z992 Dependence on renal dialysis: Secondary | ICD-10-CM | POA: Diagnosis not present

## 2018-01-07 DIAGNOSIS — N2581 Secondary hyperparathyroidism of renal origin: Secondary | ICD-10-CM | POA: Diagnosis not present

## 2018-01-07 DIAGNOSIS — N186 End stage renal disease: Secondary | ICD-10-CM | POA: Diagnosis not present

## 2018-01-07 DIAGNOSIS — Z992 Dependence on renal dialysis: Secondary | ICD-10-CM | POA: Diagnosis not present

## 2018-01-07 DIAGNOSIS — D509 Iron deficiency anemia, unspecified: Secondary | ICD-10-CM | POA: Diagnosis not present

## 2018-01-08 DIAGNOSIS — N186 End stage renal disease: Secondary | ICD-10-CM | POA: Diagnosis not present

## 2018-01-08 DIAGNOSIS — Z992 Dependence on renal dialysis: Secondary | ICD-10-CM | POA: Diagnosis not present

## 2018-01-09 DIAGNOSIS — N2581 Secondary hyperparathyroidism of renal origin: Secondary | ICD-10-CM | POA: Diagnosis not present

## 2018-01-09 DIAGNOSIS — Z992 Dependence on renal dialysis: Secondary | ICD-10-CM | POA: Diagnosis not present

## 2018-01-09 DIAGNOSIS — D509 Iron deficiency anemia, unspecified: Secondary | ICD-10-CM | POA: Diagnosis not present

## 2018-01-09 DIAGNOSIS — N186 End stage renal disease: Secondary | ICD-10-CM | POA: Diagnosis not present

## 2018-01-10 DIAGNOSIS — Z992 Dependence on renal dialysis: Secondary | ICD-10-CM | POA: Diagnosis not present

## 2018-01-10 DIAGNOSIS — N186 End stage renal disease: Secondary | ICD-10-CM | POA: Diagnosis not present

## 2018-01-11 DIAGNOSIS — N186 End stage renal disease: Secondary | ICD-10-CM | POA: Diagnosis not present

## 2018-01-11 DIAGNOSIS — Z992 Dependence on renal dialysis: Secondary | ICD-10-CM | POA: Diagnosis not present

## 2018-01-12 DIAGNOSIS — Z992 Dependence on renal dialysis: Secondary | ICD-10-CM | POA: Diagnosis not present

## 2018-01-12 DIAGNOSIS — N186 End stage renal disease: Secondary | ICD-10-CM | POA: Diagnosis not present

## 2018-01-12 DIAGNOSIS — N2581 Secondary hyperparathyroidism of renal origin: Secondary | ICD-10-CM | POA: Diagnosis not present

## 2018-01-12 DIAGNOSIS — D509 Iron deficiency anemia, unspecified: Secondary | ICD-10-CM | POA: Diagnosis not present

## 2018-01-13 DIAGNOSIS — Z992 Dependence on renal dialysis: Secondary | ICD-10-CM | POA: Diagnosis not present

## 2018-01-13 DIAGNOSIS — N186 End stage renal disease: Secondary | ICD-10-CM | POA: Diagnosis not present

## 2018-01-14 ENCOUNTER — Telehealth: Payer: Self-pay

## 2018-01-14 DIAGNOSIS — Z992 Dependence on renal dialysis: Secondary | ICD-10-CM | POA: Diagnosis not present

## 2018-01-14 DIAGNOSIS — D509 Iron deficiency anemia, unspecified: Secondary | ICD-10-CM | POA: Diagnosis not present

## 2018-01-14 DIAGNOSIS — Z1211 Encounter for screening for malignant neoplasm of colon: Secondary | ICD-10-CM

## 2018-01-14 DIAGNOSIS — N186 End stage renal disease: Secondary | ICD-10-CM | POA: Diagnosis not present

## 2018-01-14 DIAGNOSIS — N2581 Secondary hyperparathyroidism of renal origin: Secondary | ICD-10-CM | POA: Diagnosis not present

## 2018-01-14 NOTE — Telephone Encounter (Signed)
Order placed   Copied from CRM [phone]. Topic: Quick Communication - See Telephone Encounter >> Jan 13, 2018  2:44 PM Herby AbrahamJohnson, Shiquita C wrote: CRM for notification. See Telephone encounter for: pt called in for colonoscopy. He would like to have orders placed.    Please assist further.   01/13/18.

## 2018-01-15 DIAGNOSIS — Z992 Dependence on renal dialysis: Secondary | ICD-10-CM | POA: Diagnosis not present

## 2018-01-15 DIAGNOSIS — N186 End stage renal disease: Secondary | ICD-10-CM | POA: Diagnosis not present

## 2018-01-16 DIAGNOSIS — D509 Iron deficiency anemia, unspecified: Secondary | ICD-10-CM | POA: Diagnosis not present

## 2018-01-16 DIAGNOSIS — N186 End stage renal disease: Secondary | ICD-10-CM | POA: Diagnosis not present

## 2018-01-16 DIAGNOSIS — Z992 Dependence on renal dialysis: Secondary | ICD-10-CM | POA: Diagnosis not present

## 2018-01-16 DIAGNOSIS — N2581 Secondary hyperparathyroidism of renal origin: Secondary | ICD-10-CM | POA: Diagnosis not present

## 2018-01-17 DIAGNOSIS — Z9981 Dependence on supplemental oxygen: Secondary | ICD-10-CM | POA: Diagnosis not present

## 2018-01-17 DIAGNOSIS — Z87442 Personal history of urinary calculi: Secondary | ICD-10-CM | POA: Diagnosis not present

## 2018-01-17 DIAGNOSIS — D6489 Other specified anemias: Secondary | ICD-10-CM | POA: Diagnosis not present

## 2018-01-17 DIAGNOSIS — Z6834 Body mass index (BMI) 34.0-34.9, adult: Secondary | ICD-10-CM | POA: Diagnosis not present

## 2018-01-17 DIAGNOSIS — I132 Hypertensive heart and chronic kidney disease with heart failure and with stage 5 chronic kidney disease, or end stage renal disease: Secondary | ICD-10-CM | POA: Diagnosis not present

## 2018-01-17 DIAGNOSIS — M3214 Glomerular disease in systemic lupus erythematosus: Secondary | ICD-10-CM | POA: Diagnosis present

## 2018-01-17 DIAGNOSIS — G4733 Obstructive sleep apnea (adult) (pediatric): Secondary | ICD-10-CM | POA: Diagnosis not present

## 2018-01-17 DIAGNOSIS — Z87440 Personal history of urinary (tract) infections: Secondary | ICD-10-CM | POA: Diagnosis not present

## 2018-01-17 DIAGNOSIS — Z5181 Encounter for therapeutic drug level monitoring: Secondary | ICD-10-CM | POA: Diagnosis not present

## 2018-01-17 DIAGNOSIS — E8770 Fluid overload, unspecified: Secondary | ICD-10-CM | POA: Diagnosis not present

## 2018-01-17 DIAGNOSIS — E785 Hyperlipidemia, unspecified: Secondary | ICD-10-CM | POA: Diagnosis present

## 2018-01-17 DIAGNOSIS — I1311 Hypertensive heart and chronic kidney disease without heart failure, with stage 5 chronic kidney disease, or end stage renal disease: Secondary | ICD-10-CM | POA: Diagnosis not present

## 2018-01-17 DIAGNOSIS — Z79899 Other long term (current) drug therapy: Secondary | ICD-10-CM | POA: Diagnosis not present

## 2018-01-17 DIAGNOSIS — Z940 Kidney transplant status: Secondary | ICD-10-CM | POA: Diagnosis not present

## 2018-01-17 DIAGNOSIS — M109 Gout, unspecified: Secondary | ICD-10-CM | POA: Diagnosis present

## 2018-01-17 DIAGNOSIS — J95821 Acute postprocedural respiratory failure: Secondary | ICD-10-CM | POA: Diagnosis not present

## 2018-01-17 DIAGNOSIS — Z01810 Encounter for preprocedural cardiovascular examination: Secondary | ICD-10-CM | POA: Diagnosis not present

## 2018-01-17 DIAGNOSIS — Z9989 Dependence on other enabling machines and devices: Secondary | ICD-10-CM | POA: Diagnosis not present

## 2018-01-17 DIAGNOSIS — D631 Anemia in chronic kidney disease: Secondary | ICD-10-CM | POA: Diagnosis not present

## 2018-01-17 DIAGNOSIS — N2581 Secondary hyperparathyroidism of renal origin: Secondary | ICD-10-CM | POA: Diagnosis not present

## 2018-01-17 DIAGNOSIS — E669 Obesity, unspecified: Secondary | ICD-10-CM | POA: Diagnosis not present

## 2018-01-17 DIAGNOSIS — R21 Rash and other nonspecific skin eruption: Secondary | ICD-10-CM | POA: Diagnosis not present

## 2018-01-17 DIAGNOSIS — N186 End stage renal disease: Secondary | ICD-10-CM | POA: Diagnosis not present

## 2018-01-17 DIAGNOSIS — Z4822 Encounter for aftercare following kidney transplant: Secondary | ICD-10-CM | POA: Diagnosis not present

## 2018-01-17 DIAGNOSIS — T8619 Other complication of kidney transplant: Secondary | ICD-10-CM | POA: Diagnosis not present

## 2018-01-17 DIAGNOSIS — Z792 Long term (current) use of antibiotics: Secondary | ICD-10-CM | POA: Diagnosis not present

## 2018-01-17 DIAGNOSIS — J90 Pleural effusion, not elsewhere classified: Secondary | ICD-10-CM | POA: Diagnosis not present

## 2018-01-17 DIAGNOSIS — Z992 Dependence on renal dialysis: Secondary | ICD-10-CM | POA: Diagnosis not present

## 2018-01-17 DIAGNOSIS — E875 Hyperkalemia: Secondary | ICD-10-CM | POA: Diagnosis not present

## 2018-01-17 DIAGNOSIS — R918 Other nonspecific abnormal finding of lung field: Secondary | ICD-10-CM | POA: Diagnosis not present

## 2018-01-17 DIAGNOSIS — E871 Hypo-osmolality and hyponatremia: Secondary | ICD-10-CM | POA: Diagnosis not present

## 2018-01-17 DIAGNOSIS — I120 Hypertensive chronic kidney disease with stage 5 chronic kidney disease or end stage renal disease: Secondary | ICD-10-CM | POA: Diagnosis not present

## 2018-01-17 DIAGNOSIS — Z9689 Presence of other specified functional implants: Secondary | ICD-10-CM | POA: Diagnosis not present

## 2018-01-17 DIAGNOSIS — I10 Essential (primary) hypertension: Secondary | ICD-10-CM | POA: Diagnosis not present

## 2018-01-17 DIAGNOSIS — R402414 Glasgow coma scale score 13-15, 24 hours or more after hospital admission: Secondary | ICD-10-CM | POA: Diagnosis not present

## 2018-01-17 DIAGNOSIS — D680 Von Willebrand's disease: Secondary | ICD-10-CM | POA: Diagnosis present

## 2018-01-17 DIAGNOSIS — M3210 Systemic lupus erythematosus, organ or system involvement unspecified: Secondary | ICD-10-CM | POA: Diagnosis not present

## 2018-01-17 DIAGNOSIS — Z7982 Long term (current) use of aspirin: Secondary | ICD-10-CM | POA: Diagnosis not present

## 2018-01-17 DIAGNOSIS — I509 Heart failure, unspecified: Secondary | ICD-10-CM | POA: Diagnosis not present

## 2018-01-17 DIAGNOSIS — D649 Anemia, unspecified: Secondary | ICD-10-CM | POA: Diagnosis present

## 2018-01-17 DIAGNOSIS — Z960 Presence of urogenital implants: Secondary | ICD-10-CM | POA: Diagnosis not present

## 2018-01-22 MED ORDER — PANTOPRAZOLE SODIUM 40 MG PO TBEC
40.00 | DELAYED_RELEASE_TABLET | ORAL | Status: DC
Start: 2018-01-22 — End: 2018-01-22

## 2018-01-22 MED ORDER — TACROLIMUS 1 MG PO CAPS
3.00 | ORAL_CAPSULE | ORAL | Status: DC
Start: ? — End: 2018-01-22

## 2018-01-22 MED ORDER — SULFAMETHOXAZOLE-TRIMETHOPRIM 400-80 MG PO TABS
1.00 | ORAL_TABLET | ORAL | Status: DC
Start: 2018-01-25 — End: 2018-01-22

## 2018-01-22 MED ORDER — HYDROCODONE-ACETAMINOPHEN 5-325 MG PO TABS
1.00 | ORAL_TABLET | ORAL | Status: DC
Start: ? — End: 2018-01-22

## 2018-01-22 MED ORDER — CARVEDILOL 12.5 MG PO TABS
25.00 | ORAL_TABLET | ORAL | Status: DC
Start: 2018-01-22 — End: 2018-01-22

## 2018-01-22 MED ORDER — VALGANCICLOVIR HCL 450 MG PO TABS
450.00 | ORAL_TABLET | ORAL | Status: DC
Start: 2018-01-25 — End: 2018-01-22

## 2018-01-22 MED ORDER — GENERIC EXTERNAL MEDICATION
1.00 | Status: DC
Start: ? — End: 2018-01-22

## 2018-01-22 MED ORDER — HYDRALAZINE HCL 20 MG/ML IJ SOLN
10.00 | INTRAMUSCULAR | Status: DC
Start: ? — End: 2018-01-22

## 2018-01-22 MED ORDER — POLYETHYLENE GLYCOL 3350 17 G PO PACK
17.00 g | PACK | ORAL | Status: DC
Start: 2018-01-23 — End: 2018-01-22

## 2018-01-22 MED ORDER — FLUCONAZOLE 50 MG PO TABS
50.00 | ORAL_TABLET | ORAL | Status: DC
Start: 2018-01-23 — End: 2018-01-22

## 2018-01-22 MED ORDER — ONDANSETRON HCL 4 MG/2ML IJ SOLN
4.00 | INTRAMUSCULAR | Status: DC
Start: ? — End: 2018-01-22

## 2018-01-22 MED ORDER — FUROSEMIDE 40 MG PO TABS
80.00 | ORAL_TABLET | ORAL | Status: DC
Start: 2018-01-23 — End: 2018-01-22

## 2018-01-22 MED ORDER — ASPIRIN EC 81 MG PO TBEC
81.00 | DELAYED_RELEASE_TABLET | ORAL | Status: DC
Start: 2018-01-23 — End: 2018-01-22

## 2018-01-22 MED ORDER — ACETAMINOPHEN 325 MG PO TABS
325.00 | ORAL_TABLET | ORAL | Status: DC
Start: ? — End: 2018-01-22

## 2018-01-22 MED ORDER — MYCOPHENOLATE SODIUM 360 MG PO TBEC
720.00 | DELAYED_RELEASE_TABLET | ORAL | Status: DC
Start: 2018-01-22 — End: 2018-01-22

## 2018-01-22 MED ORDER — PREDNISONE 10 MG PO TABS
20.00 | ORAL_TABLET | ORAL | Status: DC
Start: 2018-01-23 — End: 2018-01-22

## 2018-01-22 MED ORDER — LABETALOL HCL 5 MG/ML IV SOLN
20.00 | INTRAVENOUS | Status: DC
Start: ? — End: 2018-01-22

## 2018-01-22 MED ORDER — AMLODIPINE BESYLATE 5 MG PO TABS
10.00 | ORAL_TABLET | ORAL | Status: DC
Start: 2018-01-23 — End: 2018-01-22

## 2018-01-25 DIAGNOSIS — M329 Systemic lupus erythematosus, unspecified: Secondary | ICD-10-CM | POA: Diagnosis not present

## 2018-01-25 DIAGNOSIS — Z4822 Encounter for aftercare following kidney transplant: Secondary | ICD-10-CM | POA: Diagnosis not present

## 2018-01-25 DIAGNOSIS — N186 End stage renal disease: Secondary | ICD-10-CM | POA: Diagnosis not present

## 2018-01-25 DIAGNOSIS — I120 Hypertensive chronic kidney disease with stage 5 chronic kidney disease or end stage renal disease: Secondary | ICD-10-CM | POA: Diagnosis not present

## 2018-01-25 DIAGNOSIS — D680 Von Willebrand's disease: Secondary | ICD-10-CM | POA: Diagnosis not present

## 2018-01-25 DIAGNOSIS — Z9989 Dependence on other enabling machines and devices: Secondary | ICD-10-CM | POA: Diagnosis not present

## 2018-01-25 DIAGNOSIS — Z960 Presence of urogenital implants: Secondary | ICD-10-CM | POA: Diagnosis not present

## 2018-01-25 DIAGNOSIS — E785 Hyperlipidemia, unspecified: Secondary | ICD-10-CM | POA: Diagnosis not present

## 2018-01-25 DIAGNOSIS — N329 Bladder disorder, unspecified: Secondary | ICD-10-CM | POA: Diagnosis not present

## 2018-01-25 DIAGNOSIS — E871 Hypo-osmolality and hyponatremia: Secondary | ICD-10-CM | POA: Diagnosis not present

## 2018-01-25 DIAGNOSIS — G4733 Obstructive sleep apnea (adult) (pediatric): Secondary | ICD-10-CM | POA: Diagnosis not present

## 2018-01-25 DIAGNOSIS — D899 Disorder involving the immune mechanism, unspecified: Secondary | ICD-10-CM | POA: Diagnosis not present

## 2018-01-25 DIAGNOSIS — I10 Essential (primary) hypertension: Secondary | ICD-10-CM | POA: Diagnosis not present

## 2018-01-25 DIAGNOSIS — Z940 Kidney transplant status: Secondary | ICD-10-CM | POA: Diagnosis not present

## 2018-01-25 DIAGNOSIS — T8619 Other complication of kidney transplant: Secondary | ICD-10-CM | POA: Diagnosis not present

## 2018-01-25 DIAGNOSIS — Z79899 Other long term (current) drug therapy: Secondary | ICD-10-CM | POA: Diagnosis not present

## 2018-01-27 DIAGNOSIS — Z940 Kidney transplant status: Secondary | ICD-10-CM | POA: Diagnosis not present

## 2018-01-27 DIAGNOSIS — Z79899 Other long term (current) drug therapy: Secondary | ICD-10-CM | POA: Diagnosis not present

## 2018-01-27 DIAGNOSIS — Z7952 Long term (current) use of systemic steroids: Secondary | ICD-10-CM | POA: Diagnosis not present

## 2018-01-27 DIAGNOSIS — N2889 Other specified disorders of kidney and ureter: Secondary | ICD-10-CM | POA: Diagnosis not present

## 2018-01-27 DIAGNOSIS — D899 Disorder involving the immune mechanism, unspecified: Secondary | ICD-10-CM | POA: Diagnosis not present

## 2018-01-27 DIAGNOSIS — E785 Hyperlipidemia, unspecified: Secondary | ICD-10-CM | POA: Diagnosis not present

## 2018-01-27 DIAGNOSIS — I151 Hypertension secondary to other renal disorders: Secondary | ICD-10-CM | POA: Diagnosis not present

## 2018-01-27 DIAGNOSIS — M329 Systemic lupus erythematosus, unspecified: Secondary | ICD-10-CM | POA: Diagnosis not present

## 2018-01-27 DIAGNOSIS — Z792 Long term (current) use of antibiotics: Secondary | ICD-10-CM | POA: Diagnosis not present

## 2018-01-27 DIAGNOSIS — Z8739 Personal history of other diseases of the musculoskeletal system and connective tissue: Secondary | ICD-10-CM | POA: Diagnosis not present

## 2018-01-27 DIAGNOSIS — Z4822 Encounter for aftercare following kidney transplant: Secondary | ICD-10-CM | POA: Diagnosis not present

## 2018-01-27 DIAGNOSIS — D649 Anemia, unspecified: Secondary | ICD-10-CM | POA: Diagnosis not present

## 2018-01-27 DIAGNOSIS — I10 Essential (primary) hypertension: Secondary | ICD-10-CM | POA: Diagnosis not present

## 2018-01-29 DIAGNOSIS — M329 Systemic lupus erythematosus, unspecified: Secondary | ICD-10-CM | POA: Diagnosis not present

## 2018-01-29 DIAGNOSIS — I120 Hypertensive chronic kidney disease with stage 5 chronic kidney disease or end stage renal disease: Secondary | ICD-10-CM | POA: Diagnosis not present

## 2018-01-29 DIAGNOSIS — D8989 Other specified disorders involving the immune mechanism, not elsewhere classified: Secondary | ICD-10-CM | POA: Diagnosis not present

## 2018-01-29 DIAGNOSIS — T8619 Other complication of kidney transplant: Secondary | ICD-10-CM | POA: Diagnosis not present

## 2018-01-29 DIAGNOSIS — M3214 Glomerular disease in systemic lupus erythematosus: Secondary | ICD-10-CM | POA: Diagnosis not present

## 2018-01-29 DIAGNOSIS — D638 Anemia in other chronic diseases classified elsewhere: Secondary | ICD-10-CM | POA: Diagnosis not present

## 2018-01-29 DIAGNOSIS — Z792 Long term (current) use of antibiotics: Secondary | ICD-10-CM | POA: Diagnosis not present

## 2018-01-29 DIAGNOSIS — N186 End stage renal disease: Secondary | ICD-10-CM | POA: Diagnosis not present

## 2018-01-29 DIAGNOSIS — E785 Hyperlipidemia, unspecified: Secondary | ICD-10-CM | POA: Diagnosis not present

## 2018-01-29 DIAGNOSIS — Z79899 Other long term (current) drug therapy: Secondary | ICD-10-CM | POA: Diagnosis not present

## 2018-01-29 DIAGNOSIS — Z7952 Long term (current) use of systemic steroids: Secondary | ICD-10-CM | POA: Diagnosis not present

## 2018-01-29 DIAGNOSIS — Z940 Kidney transplant status: Secondary | ICD-10-CM | POA: Diagnosis not present

## 2018-01-29 DIAGNOSIS — D649 Anemia, unspecified: Secondary | ICD-10-CM | POA: Diagnosis not present

## 2018-01-29 DIAGNOSIS — I10 Essential (primary) hypertension: Secondary | ICD-10-CM | POA: Diagnosis not present

## 2018-02-01 DIAGNOSIS — Z4822 Encounter for aftercare following kidney transplant: Secondary | ICD-10-CM | POA: Diagnosis not present

## 2018-02-01 DIAGNOSIS — Z79899 Other long term (current) drug therapy: Secondary | ICD-10-CM | POA: Diagnosis not present

## 2018-02-01 DIAGNOSIS — E785 Hyperlipidemia, unspecified: Secondary | ICD-10-CM | POA: Diagnosis not present

## 2018-02-01 DIAGNOSIS — D649 Anemia, unspecified: Secondary | ICD-10-CM | POA: Diagnosis not present

## 2018-02-01 DIAGNOSIS — Z940 Kidney transplant status: Secondary | ICD-10-CM | POA: Diagnosis not present

## 2018-02-01 DIAGNOSIS — E875 Hyperkalemia: Secondary | ICD-10-CM | POA: Diagnosis not present

## 2018-02-01 DIAGNOSIS — Z7952 Long term (current) use of systemic steroids: Secondary | ICD-10-CM | POA: Diagnosis not present

## 2018-02-01 DIAGNOSIS — D8989 Other specified disorders involving the immune mechanism, not elsewhere classified: Secondary | ICD-10-CM | POA: Diagnosis not present

## 2018-02-01 DIAGNOSIS — I10 Essential (primary) hypertension: Secondary | ICD-10-CM | POA: Diagnosis not present

## 2018-02-01 DIAGNOSIS — M329 Systemic lupus erythematosus, unspecified: Secondary | ICD-10-CM | POA: Diagnosis not present

## 2018-02-01 DIAGNOSIS — Z792 Long term (current) use of antibiotics: Secondary | ICD-10-CM | POA: Diagnosis not present

## 2018-02-03 DIAGNOSIS — N184 Chronic kidney disease, stage 4 (severe): Secondary | ICD-10-CM | POA: Diagnosis not present

## 2018-02-03 DIAGNOSIS — E875 Hyperkalemia: Secondary | ICD-10-CM | POA: Diagnosis not present

## 2018-02-03 DIAGNOSIS — I151 Hypertension secondary to other renal disorders: Secondary | ICD-10-CM | POA: Diagnosis not present

## 2018-02-03 DIAGNOSIS — N186 End stage renal disease: Secondary | ICD-10-CM | POA: Diagnosis not present

## 2018-02-03 DIAGNOSIS — D649 Anemia, unspecified: Secondary | ICD-10-CM | POA: Diagnosis not present

## 2018-02-03 DIAGNOSIS — R351 Nocturia: Secondary | ICD-10-CM | POA: Diagnosis not present

## 2018-02-03 DIAGNOSIS — E785 Hyperlipidemia, unspecified: Secondary | ICD-10-CM | POA: Diagnosis not present

## 2018-02-03 DIAGNOSIS — D899 Disorder involving the immune mechanism, unspecified: Secondary | ICD-10-CM | POA: Diagnosis not present

## 2018-02-03 DIAGNOSIS — Z7952 Long term (current) use of systemic steroids: Secondary | ICD-10-CM | POA: Diagnosis not present

## 2018-02-03 DIAGNOSIS — Z940 Kidney transplant status: Secondary | ICD-10-CM | POA: Diagnosis not present

## 2018-02-03 DIAGNOSIS — D631 Anemia in chronic kidney disease: Secondary | ICD-10-CM | POA: Diagnosis not present

## 2018-02-03 DIAGNOSIS — Z792 Long term (current) use of antibiotics: Secondary | ICD-10-CM | POA: Diagnosis not present

## 2018-02-03 DIAGNOSIS — Z4822 Encounter for aftercare following kidney transplant: Secondary | ICD-10-CM | POA: Diagnosis not present

## 2018-02-03 DIAGNOSIS — F5109 Other insomnia not due to a substance or known physiological condition: Secondary | ICD-10-CM | POA: Diagnosis not present

## 2018-02-03 DIAGNOSIS — Z8739 Personal history of other diseases of the musculoskeletal system and connective tissue: Secondary | ICD-10-CM | POA: Diagnosis not present

## 2018-02-03 DIAGNOSIS — N2889 Other specified disorders of kidney and ureter: Secondary | ICD-10-CM | POA: Diagnosis not present

## 2018-02-03 DIAGNOSIS — E872 Acidosis: Secondary | ICD-10-CM | POA: Diagnosis not present

## 2018-02-03 DIAGNOSIS — E869 Volume depletion, unspecified: Secondary | ICD-10-CM | POA: Diagnosis not present

## 2018-02-03 DIAGNOSIS — Z79899 Other long term (current) drug therapy: Secondary | ICD-10-CM | POA: Diagnosis not present

## 2018-02-03 DIAGNOSIS — R5383 Other fatigue: Secondary | ICD-10-CM | POA: Diagnosis not present

## 2018-02-05 DIAGNOSIS — E872 Acidosis: Secondary | ICD-10-CM | POA: Diagnosis not present

## 2018-02-05 DIAGNOSIS — Z4822 Encounter for aftercare following kidney transplant: Secondary | ICD-10-CM | POA: Diagnosis not present

## 2018-02-05 DIAGNOSIS — Z940 Kidney transplant status: Secondary | ICD-10-CM | POA: Diagnosis not present

## 2018-02-05 DIAGNOSIS — E869 Volume depletion, unspecified: Secondary | ICD-10-CM | POA: Diagnosis not present

## 2018-02-05 DIAGNOSIS — Z792 Long term (current) use of antibiotics: Secondary | ICD-10-CM | POA: Diagnosis not present

## 2018-02-05 DIAGNOSIS — D899 Disorder involving the immune mechanism, unspecified: Secondary | ICD-10-CM | POA: Diagnosis not present

## 2018-02-05 DIAGNOSIS — E875 Hyperkalemia: Secondary | ICD-10-CM | POA: Diagnosis not present

## 2018-02-05 DIAGNOSIS — E785 Hyperlipidemia, unspecified: Secondary | ICD-10-CM | POA: Diagnosis not present

## 2018-02-05 DIAGNOSIS — N2889 Other specified disorders of kidney and ureter: Secondary | ICD-10-CM | POA: Diagnosis not present

## 2018-02-05 DIAGNOSIS — I10 Essential (primary) hypertension: Secondary | ICD-10-CM | POA: Diagnosis not present

## 2018-02-05 DIAGNOSIS — Z79899 Other long term (current) drug therapy: Secondary | ICD-10-CM | POA: Diagnosis not present

## 2018-02-05 DIAGNOSIS — Z7952 Long term (current) use of systemic steroids: Secondary | ICD-10-CM | POA: Diagnosis not present

## 2018-02-05 DIAGNOSIS — D649 Anemia, unspecified: Secondary | ICD-10-CM | POA: Diagnosis not present

## 2018-02-05 DIAGNOSIS — I151 Hypertension secondary to other renal disorders: Secondary | ICD-10-CM | POA: Diagnosis not present

## 2018-02-08 DIAGNOSIS — Z79899 Other long term (current) drug therapy: Secondary | ICD-10-CM | POA: Diagnosis not present

## 2018-02-08 DIAGNOSIS — I10 Essential (primary) hypertension: Secondary | ICD-10-CM | POA: Diagnosis not present

## 2018-02-08 DIAGNOSIS — I151 Hypertension secondary to other renal disorders: Secondary | ICD-10-CM | POA: Diagnosis not present

## 2018-02-08 DIAGNOSIS — Z792 Long term (current) use of antibiotics: Secondary | ICD-10-CM | POA: Diagnosis not present

## 2018-02-08 DIAGNOSIS — I120 Hypertensive chronic kidney disease with stage 5 chronic kidney disease or end stage renal disease: Secondary | ICD-10-CM | POA: Diagnosis not present

## 2018-02-08 DIAGNOSIS — E785 Hyperlipidemia, unspecified: Secondary | ICD-10-CM | POA: Diagnosis not present

## 2018-02-08 DIAGNOSIS — T8619 Other complication of kidney transplant: Secondary | ICD-10-CM | POA: Diagnosis not present

## 2018-02-08 DIAGNOSIS — N2889 Other specified disorders of kidney and ureter: Secondary | ICD-10-CM | POA: Diagnosis not present

## 2018-02-08 DIAGNOSIS — E872 Acidosis: Secondary | ICD-10-CM | POA: Diagnosis not present

## 2018-02-08 DIAGNOSIS — R638 Other symptoms and signs concerning food and fluid intake: Secondary | ICD-10-CM | POA: Diagnosis not present

## 2018-02-08 DIAGNOSIS — E869 Volume depletion, unspecified: Secondary | ICD-10-CM | POA: Diagnosis not present

## 2018-02-08 DIAGNOSIS — Z940 Kidney transplant status: Secondary | ICD-10-CM | POA: Diagnosis not present

## 2018-02-08 DIAGNOSIS — D649 Anemia, unspecified: Secondary | ICD-10-CM | POA: Diagnosis not present

## 2018-02-08 DIAGNOSIS — Z9089 Acquired absence of other organs: Secondary | ICD-10-CM | POA: Diagnosis not present

## 2018-02-08 DIAGNOSIS — E875 Hyperkalemia: Secondary | ICD-10-CM | POA: Diagnosis not present

## 2018-02-08 DIAGNOSIS — D899 Disorder involving the immune mechanism, unspecified: Secondary | ICD-10-CM | POA: Diagnosis not present

## 2018-02-08 DIAGNOSIS — Z992 Dependence on renal dialysis: Secondary | ICD-10-CM | POA: Diagnosis not present

## 2018-02-08 DIAGNOSIS — Z7952 Long term (current) use of systemic steroids: Secondary | ICD-10-CM | POA: Diagnosis not present

## 2018-02-08 DIAGNOSIS — D631 Anemia in chronic kidney disease: Secondary | ICD-10-CM | POA: Diagnosis not present

## 2018-02-08 DIAGNOSIS — N186 End stage renal disease: Secondary | ICD-10-CM | POA: Diagnosis not present

## 2018-02-08 DIAGNOSIS — Z4802 Encounter for removal of sutures: Secondary | ICD-10-CM | POA: Diagnosis not present

## 2018-02-08 DIAGNOSIS — L989 Disorder of the skin and subcutaneous tissue, unspecified: Secondary | ICD-10-CM | POA: Diagnosis not present

## 2018-02-08 DIAGNOSIS — Z4822 Encounter for aftercare following kidney transplant: Secondary | ICD-10-CM | POA: Diagnosis not present

## 2018-02-11 DIAGNOSIS — E872 Acidosis: Secondary | ICD-10-CM | POA: Diagnosis not present

## 2018-02-11 DIAGNOSIS — E869 Volume depletion, unspecified: Secondary | ICD-10-CM | POA: Diagnosis not present

## 2018-02-11 DIAGNOSIS — I151 Hypertension secondary to other renal disorders: Secondary | ICD-10-CM | POA: Diagnosis not present

## 2018-02-11 DIAGNOSIS — D899 Disorder involving the immune mechanism, unspecified: Secondary | ICD-10-CM | POA: Diagnosis not present

## 2018-02-11 DIAGNOSIS — Z8739 Personal history of other diseases of the musculoskeletal system and connective tissue: Secondary | ICD-10-CM | POA: Diagnosis not present

## 2018-02-11 DIAGNOSIS — E785 Hyperlipidemia, unspecified: Secondary | ICD-10-CM | POA: Diagnosis not present

## 2018-02-11 DIAGNOSIS — E875 Hyperkalemia: Secondary | ICD-10-CM | POA: Diagnosis not present

## 2018-02-11 DIAGNOSIS — D649 Anemia, unspecified: Secondary | ICD-10-CM | POA: Diagnosis not present

## 2018-02-11 DIAGNOSIS — Z79899 Other long term (current) drug therapy: Secondary | ICD-10-CM | POA: Diagnosis not present

## 2018-02-11 DIAGNOSIS — Z4822 Encounter for aftercare following kidney transplant: Secondary | ICD-10-CM | POA: Diagnosis not present

## 2018-02-11 DIAGNOSIS — N2889 Other specified disorders of kidney and ureter: Secondary | ICD-10-CM | POA: Diagnosis not present

## 2018-02-11 DIAGNOSIS — I10 Essential (primary) hypertension: Secondary | ICD-10-CM | POA: Diagnosis not present

## 2018-02-11 DIAGNOSIS — Z940 Kidney transplant status: Secondary | ICD-10-CM | POA: Diagnosis not present

## 2018-02-11 DIAGNOSIS — Z7952 Long term (current) use of systemic steroids: Secondary | ICD-10-CM | POA: Diagnosis not present

## 2018-02-11 DIAGNOSIS — Z792 Long term (current) use of antibiotics: Secondary | ICD-10-CM | POA: Diagnosis not present

## 2018-02-11 DIAGNOSIS — Z9089 Acquired absence of other organs: Secondary | ICD-10-CM | POA: Diagnosis not present

## 2018-02-15 ENCOUNTER — Encounter: Payer: Self-pay | Admitting: Internal Medicine

## 2018-02-15 DIAGNOSIS — Z466 Encounter for fitting and adjustment of urinary device: Secondary | ICD-10-CM | POA: Diagnosis not present

## 2018-02-15 DIAGNOSIS — D8989 Other specified disorders involving the immune mechanism, not elsewhere classified: Secondary | ICD-10-CM | POA: Diagnosis not present

## 2018-02-15 DIAGNOSIS — E785 Hyperlipidemia, unspecified: Secondary | ICD-10-CM | POA: Diagnosis not present

## 2018-02-15 DIAGNOSIS — Z4822 Encounter for aftercare following kidney transplant: Secondary | ICD-10-CM | POA: Diagnosis not present

## 2018-02-15 DIAGNOSIS — I10 Essential (primary) hypertension: Secondary | ICD-10-CM | POA: Diagnosis not present

## 2018-02-15 DIAGNOSIS — Z792 Long term (current) use of antibiotics: Secondary | ICD-10-CM | POA: Diagnosis not present

## 2018-02-15 DIAGNOSIS — D649 Anemia, unspecified: Secondary | ICD-10-CM | POA: Diagnosis not present

## 2018-02-15 DIAGNOSIS — R319 Hematuria, unspecified: Secondary | ICD-10-CM | POA: Diagnosis not present

## 2018-02-15 DIAGNOSIS — Z9889 Other specified postprocedural states: Secondary | ICD-10-CM | POA: Diagnosis not present

## 2018-02-15 DIAGNOSIS — E872 Acidosis: Secondary | ICD-10-CM | POA: Diagnosis not present

## 2018-02-15 DIAGNOSIS — Z79899 Other long term (current) drug therapy: Secondary | ICD-10-CM | POA: Diagnosis not present

## 2018-02-15 DIAGNOSIS — M3214 Glomerular disease in systemic lupus erythematosus: Secondary | ICD-10-CM | POA: Diagnosis not present

## 2018-02-15 DIAGNOSIS — E869 Volume depletion, unspecified: Secondary | ICD-10-CM | POA: Diagnosis not present

## 2018-02-15 DIAGNOSIS — Z7952 Long term (current) use of systemic steroids: Secondary | ICD-10-CM | POA: Diagnosis not present

## 2018-02-15 DIAGNOSIS — E875 Hyperkalemia: Secondary | ICD-10-CM | POA: Diagnosis not present

## 2018-02-15 DIAGNOSIS — Z940 Kidney transplant status: Secondary | ICD-10-CM | POA: Diagnosis not present

## 2018-02-15 DIAGNOSIS — M329 Systemic lupus erythematosus, unspecified: Secondary | ICD-10-CM | POA: Diagnosis not present

## 2018-02-18 DIAGNOSIS — N186 End stage renal disease: Secondary | ICD-10-CM | POA: Diagnosis not present

## 2018-02-18 DIAGNOSIS — Z5181 Encounter for therapeutic drug level monitoring: Secondary | ICD-10-CM | POA: Diagnosis not present

## 2018-02-18 DIAGNOSIS — Z886 Allergy status to analgesic agent status: Secondary | ICD-10-CM | POA: Diagnosis not present

## 2018-02-18 DIAGNOSIS — Z01818 Encounter for other preprocedural examination: Secondary | ICD-10-CM | POA: Diagnosis not present

## 2018-02-18 DIAGNOSIS — Z4822 Encounter for aftercare following kidney transplant: Secondary | ICD-10-CM | POA: Diagnosis not present

## 2018-02-18 DIAGNOSIS — Z888 Allergy status to other drugs, medicaments and biological substances status: Secondary | ICD-10-CM | POA: Diagnosis not present

## 2018-02-18 DIAGNOSIS — Z940 Kidney transplant status: Secondary | ICD-10-CM | POA: Diagnosis not present

## 2018-02-18 DIAGNOSIS — I120 Hypertensive chronic kidney disease with stage 5 chronic kidney disease or end stage renal disease: Secondary | ICD-10-CM | POA: Diagnosis not present

## 2018-02-18 DIAGNOSIS — T8619 Other complication of kidney transplant: Secondary | ICD-10-CM | POA: Diagnosis not present

## 2018-02-18 DIAGNOSIS — Z79899 Other long term (current) drug therapy: Secondary | ICD-10-CM | POA: Diagnosis not present

## 2018-02-25 DIAGNOSIS — N2889 Other specified disorders of kidney and ureter: Secondary | ICD-10-CM | POA: Diagnosis not present

## 2018-02-25 DIAGNOSIS — R358 Other polyuria: Secondary | ICD-10-CM | POA: Diagnosis not present

## 2018-02-25 DIAGNOSIS — E785 Hyperlipidemia, unspecified: Secondary | ICD-10-CM | POA: Diagnosis not present

## 2018-02-25 DIAGNOSIS — D649 Anemia, unspecified: Secondary | ICD-10-CM | POA: Diagnosis not present

## 2018-02-25 DIAGNOSIS — I10 Essential (primary) hypertension: Secondary | ICD-10-CM | POA: Diagnosis not present

## 2018-02-25 DIAGNOSIS — E872 Acidosis: Secondary | ICD-10-CM | POA: Diagnosis not present

## 2018-02-25 DIAGNOSIS — Z4822 Encounter for aftercare following kidney transplant: Secondary | ICD-10-CM | POA: Diagnosis not present

## 2018-02-25 DIAGNOSIS — Z792 Long term (current) use of antibiotics: Secondary | ICD-10-CM | POA: Diagnosis not present

## 2018-02-25 DIAGNOSIS — R351 Nocturia: Secondary | ICD-10-CM | POA: Diagnosis not present

## 2018-02-25 DIAGNOSIS — M329 Systemic lupus erythematosus, unspecified: Secondary | ICD-10-CM | POA: Diagnosis not present

## 2018-02-25 DIAGNOSIS — I151 Hypertension secondary to other renal disorders: Secondary | ICD-10-CM | POA: Diagnosis not present

## 2018-02-25 DIAGNOSIS — E875 Hyperkalemia: Secondary | ICD-10-CM | POA: Diagnosis not present

## 2018-02-25 DIAGNOSIS — Z7952 Long term (current) use of systemic steroids: Secondary | ICD-10-CM | POA: Diagnosis not present

## 2018-02-25 DIAGNOSIS — D899 Disorder involving the immune mechanism, unspecified: Secondary | ICD-10-CM | POA: Diagnosis not present

## 2018-02-25 DIAGNOSIS — Z01818 Encounter for other preprocedural examination: Secondary | ICD-10-CM | POA: Diagnosis not present

## 2018-02-25 DIAGNOSIS — Z79899 Other long term (current) drug therapy: Secondary | ICD-10-CM | POA: Diagnosis not present

## 2018-03-01 DIAGNOSIS — N3000 Acute cystitis without hematuria: Secondary | ICD-10-CM | POA: Diagnosis not present

## 2018-03-04 DIAGNOSIS — D8989 Other specified disorders involving the immune mechanism, not elsewhere classified: Secondary | ICD-10-CM | POA: Diagnosis not present

## 2018-03-04 DIAGNOSIS — Z792 Long term (current) use of antibiotics: Secondary | ICD-10-CM | POA: Diagnosis not present

## 2018-03-04 DIAGNOSIS — Z79899 Other long term (current) drug therapy: Secondary | ICD-10-CM | POA: Diagnosis not present

## 2018-03-04 DIAGNOSIS — I10 Essential (primary) hypertension: Secondary | ICD-10-CM | POA: Diagnosis not present

## 2018-03-04 DIAGNOSIS — M329 Systemic lupus erythematosus, unspecified: Secondary | ICD-10-CM | POA: Diagnosis not present

## 2018-03-04 DIAGNOSIS — Z01818 Encounter for other preprocedural examination: Secondary | ICD-10-CM | POA: Diagnosis not present

## 2018-03-04 DIAGNOSIS — Z940 Kidney transplant status: Secondary | ICD-10-CM | POA: Diagnosis not present

## 2018-03-11 DIAGNOSIS — Z4822 Encounter for aftercare following kidney transplant: Secondary | ICD-10-CM | POA: Diagnosis not present

## 2018-03-18 DIAGNOSIS — Z4822 Encounter for aftercare following kidney transplant: Secondary | ICD-10-CM | POA: Diagnosis not present

## 2018-03-18 DIAGNOSIS — Z792 Long term (current) use of antibiotics: Secondary | ICD-10-CM | POA: Diagnosis not present

## 2018-03-18 DIAGNOSIS — Z7952 Long term (current) use of systemic steroids: Secondary | ICD-10-CM | POA: Diagnosis not present

## 2018-03-18 DIAGNOSIS — R358 Other polyuria: Secondary | ICD-10-CM | POA: Diagnosis not present

## 2018-03-18 DIAGNOSIS — D8989 Other specified disorders involving the immune mechanism, not elsewhere classified: Secondary | ICD-10-CM | POA: Diagnosis not present

## 2018-03-18 DIAGNOSIS — Z940 Kidney transplant status: Secondary | ICD-10-CM | POA: Diagnosis not present

## 2018-03-18 DIAGNOSIS — Z79899 Other long term (current) drug therapy: Secondary | ICD-10-CM | POA: Diagnosis not present

## 2018-03-18 DIAGNOSIS — D649 Anemia, unspecified: Secondary | ICD-10-CM | POA: Diagnosis not present

## 2018-03-18 DIAGNOSIS — E869 Volume depletion, unspecified: Secondary | ICD-10-CM | POA: Diagnosis not present

## 2018-03-18 DIAGNOSIS — R351 Nocturia: Secondary | ICD-10-CM | POA: Diagnosis not present

## 2018-03-18 DIAGNOSIS — B259 Cytomegaloviral disease, unspecified: Secondary | ICD-10-CM | POA: Diagnosis not present

## 2018-03-18 DIAGNOSIS — I10 Essential (primary) hypertension: Secondary | ICD-10-CM | POA: Diagnosis not present

## 2018-03-18 DIAGNOSIS — Z9119 Patient's noncompliance with other medical treatment and regimen: Secondary | ICD-10-CM | POA: Diagnosis not present

## 2018-04-01 DIAGNOSIS — D8989 Other specified disorders involving the immune mechanism, not elsewhere classified: Secondary | ICD-10-CM | POA: Diagnosis not present

## 2018-04-01 DIAGNOSIS — I10 Essential (primary) hypertension: Secondary | ICD-10-CM | POA: Diagnosis not present

## 2018-04-01 DIAGNOSIS — B259 Cytomegaloviral disease, unspecified: Secondary | ICD-10-CM | POA: Diagnosis not present

## 2018-04-01 DIAGNOSIS — D649 Anemia, unspecified: Secondary | ICD-10-CM | POA: Diagnosis not present

## 2018-04-01 DIAGNOSIS — R358 Other polyuria: Secondary | ICD-10-CM | POA: Diagnosis not present

## 2018-04-01 DIAGNOSIS — E872 Acidosis: Secondary | ICD-10-CM | POA: Diagnosis not present

## 2018-04-01 DIAGNOSIS — R351 Nocturia: Secondary | ICD-10-CM | POA: Diagnosis not present

## 2018-04-01 DIAGNOSIS — Z940 Kidney transplant status: Secondary | ICD-10-CM | POA: Diagnosis not present

## 2018-04-01 DIAGNOSIS — Z4822 Encounter for aftercare following kidney transplant: Secondary | ICD-10-CM | POA: Diagnosis not present

## 2018-04-01 DIAGNOSIS — Z7983 Long term (current) use of bisphosphonates: Secondary | ICD-10-CM | POA: Diagnosis not present

## 2018-04-01 DIAGNOSIS — Z7952 Long term (current) use of systemic steroids: Secondary | ICD-10-CM | POA: Diagnosis not present

## 2018-04-01 DIAGNOSIS — E871 Hypo-osmolality and hyponatremia: Secondary | ICD-10-CM | POA: Diagnosis not present

## 2018-04-01 DIAGNOSIS — R61 Generalized hyperhidrosis: Secondary | ICD-10-CM | POA: Diagnosis not present

## 2018-04-01 DIAGNOSIS — E785 Hyperlipidemia, unspecified: Secondary | ICD-10-CM | POA: Diagnosis not present

## 2018-04-01 DIAGNOSIS — Z792 Long term (current) use of antibiotics: Secondary | ICD-10-CM | POA: Diagnosis not present

## 2018-04-01 DIAGNOSIS — K649 Unspecified hemorrhoids: Secondary | ICD-10-CM | POA: Diagnosis not present

## 2018-04-01 DIAGNOSIS — E875 Hyperkalemia: Secondary | ICD-10-CM | POA: Diagnosis not present

## 2018-04-01 DIAGNOSIS — Z79899 Other long term (current) drug therapy: Secondary | ICD-10-CM | POA: Diagnosis not present

## 2018-04-01 DIAGNOSIS — K625 Hemorrhage of anus and rectum: Secondary | ICD-10-CM | POA: Diagnosis not present

## 2018-04-15 DIAGNOSIS — E875 Hyperkalemia: Secondary | ICD-10-CM | POA: Diagnosis not present

## 2018-04-15 DIAGNOSIS — K625 Hemorrhage of anus and rectum: Secondary | ICD-10-CM | POA: Diagnosis not present

## 2018-04-15 DIAGNOSIS — R358 Other polyuria: Secondary | ICD-10-CM | POA: Diagnosis not present

## 2018-04-15 DIAGNOSIS — E872 Acidosis: Secondary | ICD-10-CM | POA: Diagnosis not present

## 2018-04-15 DIAGNOSIS — D649 Anemia, unspecified: Secondary | ICD-10-CM | POA: Diagnosis not present

## 2018-04-15 DIAGNOSIS — Z79899 Other long term (current) drug therapy: Secondary | ICD-10-CM | POA: Diagnosis not present

## 2018-04-15 DIAGNOSIS — Z7952 Long term (current) use of systemic steroids: Secondary | ICD-10-CM | POA: Diagnosis not present

## 2018-04-15 DIAGNOSIS — Z792 Long term (current) use of antibiotics: Secondary | ICD-10-CM | POA: Diagnosis not present

## 2018-04-15 DIAGNOSIS — M329 Systemic lupus erythematosus, unspecified: Secondary | ICD-10-CM | POA: Diagnosis not present

## 2018-04-15 DIAGNOSIS — I10 Essential (primary) hypertension: Secondary | ICD-10-CM | POA: Diagnosis not present

## 2018-04-15 DIAGNOSIS — Z940 Kidney transplant status: Secondary | ICD-10-CM | POA: Diagnosis not present

## 2018-04-15 DIAGNOSIS — E785 Hyperlipidemia, unspecified: Secondary | ICD-10-CM | POA: Diagnosis not present

## 2018-04-15 DIAGNOSIS — K649 Unspecified hemorrhoids: Secondary | ICD-10-CM | POA: Diagnosis not present

## 2018-04-15 DIAGNOSIS — Z4822 Encounter for aftercare following kidney transplant: Secondary | ICD-10-CM | POA: Diagnosis not present

## 2018-04-15 DIAGNOSIS — N3943 Post-void dribbling: Secondary | ICD-10-CM | POA: Diagnosis not present

## 2018-04-15 DIAGNOSIS — B259 Cytomegaloviral disease, unspecified: Secondary | ICD-10-CM | POA: Diagnosis not present

## 2018-04-15 DIAGNOSIS — R351 Nocturia: Secondary | ICD-10-CM | POA: Diagnosis not present

## 2018-04-15 DIAGNOSIS — N529 Male erectile dysfunction, unspecified: Secondary | ICD-10-CM | POA: Diagnosis not present

## 2018-04-15 DIAGNOSIS — Z131 Encounter for screening for diabetes mellitus: Secondary | ICD-10-CM | POA: Diagnosis not present

## 2018-04-15 DIAGNOSIS — D8989 Other specified disorders involving the immune mechanism, not elsewhere classified: Secondary | ICD-10-CM | POA: Diagnosis not present

## 2018-04-15 DIAGNOSIS — R61 Generalized hyperhidrosis: Secondary | ICD-10-CM | POA: Diagnosis not present

## 2018-04-15 DIAGNOSIS — B349 Viral infection, unspecified: Secondary | ICD-10-CM | POA: Diagnosis not present

## 2018-04-22 DIAGNOSIS — Z940 Kidney transplant status: Secondary | ICD-10-CM | POA: Diagnosis not present

## 2018-04-22 DIAGNOSIS — R350 Frequency of micturition: Secondary | ICD-10-CM | POA: Diagnosis not present

## 2018-04-26 DIAGNOSIS — R350 Frequency of micturition: Secondary | ICD-10-CM | POA: Diagnosis not present

## 2018-04-26 DIAGNOSIS — R358 Other polyuria: Secondary | ICD-10-CM | POA: Diagnosis not present

## 2018-04-26 DIAGNOSIS — N3943 Post-void dribbling: Secondary | ICD-10-CM | POA: Diagnosis not present

## 2018-04-26 DIAGNOSIS — D899 Disorder involving the immune mechanism, unspecified: Secondary | ICD-10-CM | POA: Diagnosis not present

## 2018-04-26 DIAGNOSIS — R351 Nocturia: Secondary | ICD-10-CM | POA: Diagnosis not present

## 2018-04-28 DIAGNOSIS — Z4822 Encounter for aftercare following kidney transplant: Secondary | ICD-10-CM | POA: Diagnosis not present

## 2018-04-28 DIAGNOSIS — T8610 Unspecified complication of kidney transplant: Secondary | ICD-10-CM | POA: Diagnosis not present

## 2018-05-19 DIAGNOSIS — E875 Hyperkalemia: Secondary | ICD-10-CM | POA: Diagnosis not present

## 2018-05-19 DIAGNOSIS — Z4822 Encounter for aftercare following kidney transplant: Secondary | ICD-10-CM | POA: Diagnosis not present

## 2018-05-19 DIAGNOSIS — B258 Other cytomegaloviral diseases: Secondary | ICD-10-CM | POA: Diagnosis not present

## 2018-05-19 DIAGNOSIS — Z940 Kidney transplant status: Secondary | ICD-10-CM | POA: Diagnosis not present

## 2018-05-19 DIAGNOSIS — Z131 Encounter for screening for diabetes mellitus: Secondary | ICD-10-CM | POA: Diagnosis not present

## 2018-05-19 DIAGNOSIS — Z79899 Other long term (current) drug therapy: Secondary | ICD-10-CM | POA: Diagnosis not present

## 2018-05-19 DIAGNOSIS — D8989 Other specified disorders involving the immune mechanism, not elsewhere classified: Secondary | ICD-10-CM | POA: Diagnosis not present

## 2018-05-19 DIAGNOSIS — Z792 Long term (current) use of antibiotics: Secondary | ICD-10-CM | POA: Diagnosis not present

## 2018-05-19 DIAGNOSIS — I10 Essential (primary) hypertension: Secondary | ICD-10-CM | POA: Diagnosis not present

## 2018-05-19 DIAGNOSIS — M329 Systemic lupus erythematosus, unspecified: Secondary | ICD-10-CM | POA: Diagnosis not present

## 2018-05-19 DIAGNOSIS — R351 Nocturia: Secondary | ICD-10-CM | POA: Diagnosis not present

## 2018-05-19 DIAGNOSIS — E872 Acidosis: Secondary | ICD-10-CM | POA: Diagnosis not present

## 2018-05-19 DIAGNOSIS — K625 Hemorrhage of anus and rectum: Secondary | ICD-10-CM | POA: Diagnosis not present

## 2018-05-19 DIAGNOSIS — Z7952 Long term (current) use of systemic steroids: Secondary | ICD-10-CM | POA: Diagnosis not present

## 2018-05-19 DIAGNOSIS — D649 Anemia, unspecified: Secondary | ICD-10-CM | POA: Diagnosis not present

## 2018-05-19 DIAGNOSIS — E785 Hyperlipidemia, unspecified: Secondary | ICD-10-CM | POA: Diagnosis not present

## 2018-05-24 DIAGNOSIS — Z4822 Encounter for aftercare following kidney transplant: Secondary | ICD-10-CM | POA: Diagnosis not present

## 2018-05-27 DIAGNOSIS — Z4822 Encounter for aftercare following kidney transplant: Secondary | ICD-10-CM | POA: Diagnosis not present

## 2018-05-31 DIAGNOSIS — N3943 Post-void dribbling: Secondary | ICD-10-CM | POA: Diagnosis not present

## 2018-05-31 DIAGNOSIS — Z4822 Encounter for aftercare following kidney transplant: Secondary | ICD-10-CM | POA: Diagnosis not present

## 2018-05-31 DIAGNOSIS — R32 Unspecified urinary incontinence: Secondary | ICD-10-CM | POA: Diagnosis not present

## 2018-06-03 DIAGNOSIS — Z940 Kidney transplant status: Secondary | ICD-10-CM | POA: Diagnosis not present

## 2018-06-03 DIAGNOSIS — Z79899 Other long term (current) drug therapy: Secondary | ICD-10-CM | POA: Diagnosis not present

## 2018-06-03 DIAGNOSIS — Z4822 Encounter for aftercare following kidney transplant: Secondary | ICD-10-CM | POA: Diagnosis not present

## 2018-06-03 DIAGNOSIS — D72819 Decreased white blood cell count, unspecified: Secondary | ICD-10-CM | POA: Diagnosis not present

## 2018-06-03 DIAGNOSIS — Z131 Encounter for screening for diabetes mellitus: Secondary | ICD-10-CM | POA: Diagnosis not present

## 2018-06-03 DIAGNOSIS — K625 Hemorrhage of anus and rectum: Secondary | ICD-10-CM | POA: Diagnosis not present

## 2018-06-03 DIAGNOSIS — Z5181 Encounter for therapeutic drug level monitoring: Secondary | ICD-10-CM | POA: Diagnosis not present

## 2018-06-03 DIAGNOSIS — D8989 Other specified disorders involving the immune mechanism, not elsewhere classified: Secondary | ICD-10-CM | POA: Diagnosis not present

## 2018-06-03 DIAGNOSIS — E875 Hyperkalemia: Secondary | ICD-10-CM | POA: Diagnosis not present

## 2018-06-03 DIAGNOSIS — M329 Systemic lupus erythematosus, unspecified: Secondary | ICD-10-CM | POA: Diagnosis not present

## 2018-06-03 DIAGNOSIS — E785 Hyperlipidemia, unspecified: Secondary | ICD-10-CM | POA: Diagnosis not present

## 2018-06-03 DIAGNOSIS — I10 Essential (primary) hypertension: Secondary | ICD-10-CM | POA: Diagnosis not present

## 2018-06-03 DIAGNOSIS — E872 Acidosis: Secondary | ICD-10-CM | POA: Diagnosis not present

## 2018-06-03 DIAGNOSIS — I151 Hypertension secondary to other renal disorders: Secondary | ICD-10-CM | POA: Diagnosis not present

## 2018-06-03 DIAGNOSIS — Z7952 Long term (current) use of systemic steroids: Secondary | ICD-10-CM | POA: Diagnosis not present

## 2018-06-03 DIAGNOSIS — D649 Anemia, unspecified: Secondary | ICD-10-CM | POA: Diagnosis not present

## 2018-06-03 DIAGNOSIS — B258 Other cytomegaloviral diseases: Secondary | ICD-10-CM | POA: Diagnosis not present

## 2018-06-03 DIAGNOSIS — Z792 Long term (current) use of antibiotics: Secondary | ICD-10-CM | POA: Diagnosis not present

## 2018-06-03 DIAGNOSIS — R351 Nocturia: Secondary | ICD-10-CM | POA: Diagnosis not present

## 2018-06-10 DIAGNOSIS — E785 Hyperlipidemia, unspecified: Secondary | ICD-10-CM | POA: Diagnosis not present

## 2018-06-10 DIAGNOSIS — D631 Anemia in chronic kidney disease: Secondary | ICD-10-CM | POA: Diagnosis not present

## 2018-06-10 DIAGNOSIS — N186 End stage renal disease: Secondary | ICD-10-CM | POA: Diagnosis not present

## 2018-06-10 DIAGNOSIS — R51 Headache: Secondary | ICD-10-CM | POA: Diagnosis not present

## 2018-06-10 DIAGNOSIS — Z7952 Long term (current) use of systemic steroids: Secondary | ICD-10-CM | POA: Diagnosis not present

## 2018-06-10 DIAGNOSIS — I120 Hypertensive chronic kidney disease with stage 5 chronic kidney disease or end stage renal disease: Secondary | ICD-10-CM | POA: Diagnosis not present

## 2018-06-10 DIAGNOSIS — Z940 Kidney transplant status: Secondary | ICD-10-CM | POA: Diagnosis not present

## 2018-06-10 DIAGNOSIS — M3214 Glomerular disease in systemic lupus erythematosus: Secondary | ICD-10-CM | POA: Diagnosis not present

## 2018-06-10 DIAGNOSIS — G4733 Obstructive sleep apnea (adult) (pediatric): Secondary | ICD-10-CM | POA: Diagnosis not present

## 2018-06-10 DIAGNOSIS — D702 Other drug-induced agranulocytosis: Secondary | ICD-10-CM | POA: Diagnosis not present

## 2018-06-10 DIAGNOSIS — E8881 Metabolic syndrome: Secondary | ICD-10-CM | POA: Diagnosis not present

## 2018-06-10 DIAGNOSIS — D72819 Decreased white blood cell count, unspecified: Secondary | ICD-10-CM | POA: Diagnosis not present

## 2018-06-10 DIAGNOSIS — E875 Hyperkalemia: Secondary | ICD-10-CM | POA: Diagnosis not present

## 2018-06-10 DIAGNOSIS — E872 Acidosis: Secondary | ICD-10-CM | POA: Diagnosis not present

## 2018-06-10 DIAGNOSIS — Z79899 Other long term (current) drug therapy: Secondary | ICD-10-CM | POA: Diagnosis not present

## 2018-06-10 DIAGNOSIS — Z4822 Encounter for aftercare following kidney transplant: Secondary | ICD-10-CM | POA: Diagnosis not present

## 2018-06-10 DIAGNOSIS — B259 Cytomegaloviral disease, unspecified: Secondary | ICD-10-CM | POA: Diagnosis not present

## 2018-06-10 DIAGNOSIS — K625 Hemorrhage of anus and rectum: Secondary | ICD-10-CM | POA: Diagnosis not present

## 2018-06-15 DIAGNOSIS — K625 Hemorrhage of anus and rectum: Secondary | ICD-10-CM | POA: Diagnosis not present

## 2018-06-15 DIAGNOSIS — Z940 Kidney transplant status: Secondary | ICD-10-CM | POA: Diagnosis not present

## 2018-06-15 DIAGNOSIS — Z888 Allergy status to other drugs, medicaments and biological substances status: Secondary | ICD-10-CM | POA: Diagnosis not present

## 2018-06-15 DIAGNOSIS — Z886 Allergy status to analgesic agent status: Secondary | ICD-10-CM | POA: Diagnosis not present

## 2018-06-17 DIAGNOSIS — Z4822 Encounter for aftercare following kidney transplant: Secondary | ICD-10-CM | POA: Diagnosis not present

## 2018-06-24 DIAGNOSIS — B259 Cytomegaloviral disease, unspecified: Secondary | ICD-10-CM | POA: Diagnosis not present

## 2018-06-24 DIAGNOSIS — Z131 Encounter for screening for diabetes mellitus: Secondary | ICD-10-CM | POA: Diagnosis not present

## 2018-06-24 DIAGNOSIS — G4733 Obstructive sleep apnea (adult) (pediatric): Secondary | ICD-10-CM | POA: Diagnosis not present

## 2018-06-24 DIAGNOSIS — I120 Hypertensive chronic kidney disease with stage 5 chronic kidney disease or end stage renal disease: Secondary | ICD-10-CM | POA: Diagnosis not present

## 2018-06-24 DIAGNOSIS — Z940 Kidney transplant status: Secondary | ICD-10-CM | POA: Diagnosis not present

## 2018-06-24 DIAGNOSIS — Z01818 Encounter for other preprocedural examination: Secondary | ICD-10-CM | POA: Diagnosis not present

## 2018-06-24 DIAGNOSIS — Z5181 Encounter for therapeutic drug level monitoring: Secondary | ICD-10-CM | POA: Diagnosis not present

## 2018-06-24 DIAGNOSIS — Z9989 Dependence on other enabling machines and devices: Secondary | ICD-10-CM | POA: Diagnosis not present

## 2018-06-24 DIAGNOSIS — R351 Nocturia: Secondary | ICD-10-CM | POA: Diagnosis not present

## 2018-06-24 DIAGNOSIS — E872 Acidosis: Secondary | ICD-10-CM | POA: Diagnosis not present

## 2018-06-24 DIAGNOSIS — Z4822 Encounter for aftercare following kidney transplant: Secondary | ICD-10-CM | POA: Diagnosis not present

## 2018-06-24 DIAGNOSIS — Z79899 Other long term (current) drug therapy: Secondary | ICD-10-CM | POA: Diagnosis not present

## 2018-06-24 DIAGNOSIS — D631 Anemia in chronic kidney disease: Secondary | ICD-10-CM | POA: Diagnosis not present

## 2018-06-24 DIAGNOSIS — N186 End stage renal disease: Secondary | ICD-10-CM | POA: Diagnosis not present

## 2018-06-24 DIAGNOSIS — Z7951 Long term (current) use of inhaled steroids: Secondary | ICD-10-CM | POA: Diagnosis not present

## 2018-06-24 DIAGNOSIS — K625 Hemorrhage of anus and rectum: Secondary | ICD-10-CM | POA: Diagnosis not present

## 2018-06-24 DIAGNOSIS — I151 Hypertension secondary to other renal disorders: Secondary | ICD-10-CM | POA: Diagnosis not present

## 2018-06-24 DIAGNOSIS — M3214 Glomerular disease in systemic lupus erythematosus: Secondary | ICD-10-CM | POA: Diagnosis not present

## 2018-06-24 DIAGNOSIS — D899 Disorder involving the immune mechanism, unspecified: Secondary | ICD-10-CM | POA: Diagnosis not present

## 2018-06-24 DIAGNOSIS — B349 Viral infection, unspecified: Secondary | ICD-10-CM | POA: Diagnosis not present

## 2018-06-24 DIAGNOSIS — E785 Hyperlipidemia, unspecified: Secondary | ICD-10-CM | POA: Diagnosis not present

## 2018-06-24 DIAGNOSIS — N183 Chronic kidney disease, stage 3 (moderate): Secondary | ICD-10-CM | POA: Diagnosis not present

## 2018-06-24 DIAGNOSIS — D72819 Decreased white blood cell count, unspecified: Secondary | ICD-10-CM | POA: Diagnosis not present

## 2018-06-24 DIAGNOSIS — E875 Hyperkalemia: Secondary | ICD-10-CM | POA: Diagnosis not present

## 2018-07-01 DIAGNOSIS — Z4822 Encounter for aftercare following kidney transplant: Secondary | ICD-10-CM | POA: Diagnosis not present

## 2018-07-08 DIAGNOSIS — D72819 Decreased white blood cell count, unspecified: Secondary | ICD-10-CM | POA: Diagnosis not present

## 2018-07-08 DIAGNOSIS — Z7952 Long term (current) use of systemic steroids: Secondary | ICD-10-CM | POA: Diagnosis not present

## 2018-07-08 DIAGNOSIS — I10 Essential (primary) hypertension: Secondary | ICD-10-CM | POA: Diagnosis not present

## 2018-07-08 DIAGNOSIS — E871 Hypo-osmolality and hyponatremia: Secondary | ICD-10-CM | POA: Diagnosis not present

## 2018-07-08 DIAGNOSIS — R351 Nocturia: Secondary | ICD-10-CM | POA: Diagnosis not present

## 2018-07-08 DIAGNOSIS — G4733 Obstructive sleep apnea (adult) (pediatric): Secondary | ICD-10-CM | POA: Diagnosis not present

## 2018-07-08 DIAGNOSIS — E872 Acidosis: Secondary | ICD-10-CM | POA: Diagnosis not present

## 2018-07-08 DIAGNOSIS — R51 Headache: Secondary | ICD-10-CM | POA: Diagnosis not present

## 2018-07-08 DIAGNOSIS — Z79899 Other long term (current) drug therapy: Secondary | ICD-10-CM | POA: Diagnosis not present

## 2018-07-08 DIAGNOSIS — B259 Cytomegaloviral disease, unspecified: Secondary | ICD-10-CM | POA: Diagnosis not present

## 2018-07-08 DIAGNOSIS — Z792 Long term (current) use of antibiotics: Secondary | ICD-10-CM | POA: Diagnosis not present

## 2018-07-08 DIAGNOSIS — E875 Hyperkalemia: Secondary | ICD-10-CM | POA: Diagnosis not present

## 2018-07-08 DIAGNOSIS — D649 Anemia, unspecified: Secondary | ICD-10-CM | POA: Diagnosis not present

## 2018-07-08 DIAGNOSIS — E785 Hyperlipidemia, unspecified: Secondary | ICD-10-CM | POA: Diagnosis not present

## 2018-07-08 DIAGNOSIS — Z4822 Encounter for aftercare following kidney transplant: Secondary | ICD-10-CM | POA: Diagnosis not present

## 2018-07-08 DIAGNOSIS — Z940 Kidney transplant status: Secondary | ICD-10-CM | POA: Diagnosis not present

## 2018-07-08 DIAGNOSIS — E119 Type 2 diabetes mellitus without complications: Secondary | ICD-10-CM | POA: Diagnosis not present

## 2018-07-08 DIAGNOSIS — D8989 Other specified disorders involving the immune mechanism, not elsewhere classified: Secondary | ICD-10-CM | POA: Diagnosis not present

## 2018-07-08 DIAGNOSIS — K625 Hemorrhage of anus and rectum: Secondary | ICD-10-CM | POA: Diagnosis not present

## 2018-07-08 DIAGNOSIS — Z7983 Long term (current) use of bisphosphonates: Secondary | ICD-10-CM | POA: Diagnosis not present

## 2018-07-15 DIAGNOSIS — Z7952 Long term (current) use of systemic steroids: Secondary | ICD-10-CM | POA: Diagnosis not present

## 2018-07-15 DIAGNOSIS — G4733 Obstructive sleep apnea (adult) (pediatric): Secondary | ICD-10-CM | POA: Diagnosis not present

## 2018-07-15 DIAGNOSIS — Z4822 Encounter for aftercare following kidney transplant: Secondary | ICD-10-CM | POA: Diagnosis not present

## 2018-07-15 DIAGNOSIS — D72819 Decreased white blood cell count, unspecified: Secondary | ICD-10-CM | POA: Diagnosis not present

## 2018-07-15 DIAGNOSIS — D649 Anemia, unspecified: Secondary | ICD-10-CM | POA: Diagnosis not present

## 2018-07-15 DIAGNOSIS — M329 Systemic lupus erythematosus, unspecified: Secondary | ICD-10-CM | POA: Diagnosis not present

## 2018-07-15 DIAGNOSIS — I10 Essential (primary) hypertension: Secondary | ICD-10-CM | POA: Diagnosis not present

## 2018-07-15 DIAGNOSIS — B258 Other cytomegaloviral diseases: Secondary | ICD-10-CM | POA: Diagnosis not present

## 2018-07-15 DIAGNOSIS — D702 Other drug-induced agranulocytosis: Secondary | ICD-10-CM | POA: Diagnosis not present

## 2018-07-15 DIAGNOSIS — N2889 Other specified disorders of kidney and ureter: Secondary | ICD-10-CM | POA: Diagnosis not present

## 2018-07-15 DIAGNOSIS — E871 Hypo-osmolality and hyponatremia: Secondary | ICD-10-CM | POA: Diagnosis not present

## 2018-07-15 DIAGNOSIS — Z79899 Other long term (current) drug therapy: Secondary | ICD-10-CM | POA: Diagnosis not present

## 2018-07-15 DIAGNOSIS — E875 Hyperkalemia: Secondary | ICD-10-CM | POA: Diagnosis not present

## 2018-07-15 DIAGNOSIS — Z131 Encounter for screening for diabetes mellitus: Secondary | ICD-10-CM | POA: Diagnosis not present

## 2018-07-15 DIAGNOSIS — E785 Hyperlipidemia, unspecified: Secondary | ICD-10-CM | POA: Diagnosis not present

## 2018-07-15 DIAGNOSIS — R51 Headache: Secondary | ICD-10-CM | POA: Diagnosis not present

## 2018-07-15 DIAGNOSIS — E872 Acidosis: Secondary | ICD-10-CM | POA: Diagnosis not present

## 2018-07-15 DIAGNOSIS — R351 Nocturia: Secondary | ICD-10-CM | POA: Diagnosis not present

## 2018-07-15 DIAGNOSIS — Z792 Long term (current) use of antibiotics: Secondary | ICD-10-CM | POA: Diagnosis not present

## 2018-07-15 DIAGNOSIS — Z9989 Dependence on other enabling machines and devices: Secondary | ICD-10-CM | POA: Diagnosis not present

## 2018-07-15 DIAGNOSIS — Z940 Kidney transplant status: Secondary | ICD-10-CM | POA: Diagnosis not present

## 2018-07-15 DIAGNOSIS — I151 Hypertension secondary to other renal disorders: Secondary | ICD-10-CM | POA: Diagnosis not present

## 2018-07-15 DIAGNOSIS — K625 Hemorrhage of anus and rectum: Secondary | ICD-10-CM | POA: Diagnosis not present

## 2018-07-15 DIAGNOSIS — B259 Cytomegaloviral disease, unspecified: Secondary | ICD-10-CM | POA: Diagnosis not present

## 2018-07-22 DIAGNOSIS — K625 Hemorrhage of anus and rectum: Secondary | ICD-10-CM | POA: Diagnosis not present

## 2018-07-22 DIAGNOSIS — Z79899 Other long term (current) drug therapy: Secondary | ICD-10-CM | POA: Diagnosis not present

## 2018-07-22 DIAGNOSIS — B259 Cytomegaloviral disease, unspecified: Secondary | ICD-10-CM | POA: Diagnosis not present

## 2018-07-22 DIAGNOSIS — Z4822 Encounter for aftercare following kidney transplant: Secondary | ICD-10-CM | POA: Diagnosis not present

## 2018-07-22 DIAGNOSIS — Z5181 Encounter for therapeutic drug level monitoring: Secondary | ICD-10-CM | POA: Diagnosis not present

## 2018-07-22 DIAGNOSIS — Z01818 Encounter for other preprocedural examination: Secondary | ICD-10-CM | POA: Diagnosis not present

## 2018-07-22 DIAGNOSIS — E875 Hyperkalemia: Secondary | ICD-10-CM | POA: Diagnosis not present

## 2018-07-22 DIAGNOSIS — Z940 Kidney transplant status: Secondary | ICD-10-CM | POA: Diagnosis not present

## 2018-07-22 DIAGNOSIS — M329 Systemic lupus erythematosus, unspecified: Secondary | ICD-10-CM | POA: Diagnosis not present

## 2018-07-22 DIAGNOSIS — E872 Acidosis: Secondary | ICD-10-CM | POA: Diagnosis not present

## 2018-07-22 DIAGNOSIS — D72819 Decreased white blood cell count, unspecified: Secondary | ICD-10-CM | POA: Diagnosis not present

## 2018-07-22 DIAGNOSIS — D649 Anemia, unspecified: Secondary | ICD-10-CM | POA: Diagnosis not present

## 2018-07-22 DIAGNOSIS — Z7952 Long term (current) use of systemic steroids: Secondary | ICD-10-CM | POA: Diagnosis not present

## 2018-07-22 DIAGNOSIS — E785 Hyperlipidemia, unspecified: Secondary | ICD-10-CM | POA: Diagnosis not present

## 2018-07-22 DIAGNOSIS — E8881 Metabolic syndrome: Secondary | ICD-10-CM | POA: Diagnosis not present

## 2018-07-22 DIAGNOSIS — D703 Neutropenia due to infection: Secondary | ICD-10-CM | POA: Diagnosis not present

## 2018-07-22 DIAGNOSIS — E119 Type 2 diabetes mellitus without complications: Secondary | ICD-10-CM | POA: Diagnosis not present

## 2018-07-22 DIAGNOSIS — I10 Essential (primary) hypertension: Secondary | ICD-10-CM | POA: Diagnosis not present

## 2018-07-29 DIAGNOSIS — Z888 Allergy status to other drugs, medicaments and biological substances status: Secondary | ICD-10-CM | POA: Diagnosis not present

## 2018-07-29 DIAGNOSIS — Z7952 Long term (current) use of systemic steroids: Secondary | ICD-10-CM | POA: Diagnosis not present

## 2018-07-29 DIAGNOSIS — I10 Essential (primary) hypertension: Secondary | ICD-10-CM | POA: Diagnosis not present

## 2018-07-29 DIAGNOSIS — Z4822 Encounter for aftercare following kidney transplant: Secondary | ICD-10-CM | POA: Diagnosis not present

## 2018-07-29 DIAGNOSIS — R351 Nocturia: Secondary | ICD-10-CM | POA: Diagnosis not present

## 2018-07-29 DIAGNOSIS — E785 Hyperlipidemia, unspecified: Secondary | ICD-10-CM | POA: Diagnosis not present

## 2018-07-29 DIAGNOSIS — D72819 Decreased white blood cell count, unspecified: Secondary | ICD-10-CM | POA: Diagnosis not present

## 2018-07-29 DIAGNOSIS — D649 Anemia, unspecified: Secondary | ICD-10-CM | POA: Diagnosis not present

## 2018-07-29 DIAGNOSIS — Z886 Allergy status to analgesic agent status: Secondary | ICD-10-CM | POA: Diagnosis not present

## 2018-08-05 DIAGNOSIS — N2889 Other specified disorders of kidney and ureter: Secondary | ICD-10-CM | POA: Diagnosis not present

## 2018-08-05 DIAGNOSIS — E119 Type 2 diabetes mellitus without complications: Secondary | ICD-10-CM | POA: Diagnosis not present

## 2018-08-05 DIAGNOSIS — E872 Acidosis: Secondary | ICD-10-CM | POA: Diagnosis not present

## 2018-08-05 DIAGNOSIS — Z01818 Encounter for other preprocedural examination: Secondary | ICD-10-CM | POA: Diagnosis not present

## 2018-08-05 DIAGNOSIS — Z79899 Other long term (current) drug therapy: Secondary | ICD-10-CM | POA: Diagnosis not present

## 2018-08-05 DIAGNOSIS — E875 Hyperkalemia: Secondary | ICD-10-CM | POA: Diagnosis not present

## 2018-08-05 DIAGNOSIS — D72819 Decreased white blood cell count, unspecified: Secondary | ICD-10-CM | POA: Diagnosis not present

## 2018-08-05 DIAGNOSIS — M329 Systemic lupus erythematosus, unspecified: Secondary | ICD-10-CM | POA: Diagnosis not present

## 2018-08-05 DIAGNOSIS — Z23 Encounter for immunization: Secondary | ICD-10-CM | POA: Diagnosis not present

## 2018-08-05 DIAGNOSIS — Z5181 Encounter for therapeutic drug level monitoring: Secondary | ICD-10-CM | POA: Diagnosis not present

## 2018-08-05 DIAGNOSIS — Z940 Kidney transplant status: Secondary | ICD-10-CM | POA: Diagnosis not present

## 2018-08-05 DIAGNOSIS — D703 Neutropenia due to infection: Secondary | ICD-10-CM | POA: Diagnosis not present

## 2018-08-05 DIAGNOSIS — I151 Hypertension secondary to other renal disorders: Secondary | ICD-10-CM | POA: Diagnosis not present

## 2018-08-05 DIAGNOSIS — E785 Hyperlipidemia, unspecified: Secondary | ICD-10-CM | POA: Diagnosis not present

## 2018-08-05 DIAGNOSIS — R351 Nocturia: Secondary | ICD-10-CM | POA: Diagnosis not present

## 2018-08-05 DIAGNOSIS — D649 Anemia, unspecified: Secondary | ICD-10-CM | POA: Diagnosis not present

## 2018-08-05 DIAGNOSIS — Z7952 Long term (current) use of systemic steroids: Secondary | ICD-10-CM | POA: Diagnosis not present

## 2018-08-05 DIAGNOSIS — Z4822 Encounter for aftercare following kidney transplant: Secondary | ICD-10-CM | POA: Diagnosis not present

## 2018-08-05 DIAGNOSIS — B259 Cytomegaloviral disease, unspecified: Secondary | ICD-10-CM | POA: Diagnosis not present

## 2018-08-05 DIAGNOSIS — I10 Essential (primary) hypertension: Secondary | ICD-10-CM | POA: Diagnosis not present

## 2018-08-12 DIAGNOSIS — Z7952 Long term (current) use of systemic steroids: Secondary | ICD-10-CM | POA: Diagnosis not present

## 2018-08-12 DIAGNOSIS — Z792 Long term (current) use of antibiotics: Secondary | ICD-10-CM | POA: Diagnosis not present

## 2018-08-12 DIAGNOSIS — Z79899 Other long term (current) drug therapy: Secondary | ICD-10-CM | POA: Diagnosis not present

## 2018-08-12 DIAGNOSIS — Z4822 Encounter for aftercare following kidney transplant: Secondary | ICD-10-CM | POA: Diagnosis not present

## 2018-08-19 DIAGNOSIS — Z4822 Encounter for aftercare following kidney transplant: Secondary | ICD-10-CM | POA: Diagnosis not present

## 2018-08-19 DIAGNOSIS — Z79899 Other long term (current) drug therapy: Secondary | ICD-10-CM | POA: Diagnosis not present

## 2018-08-26 DIAGNOSIS — Z4822 Encounter for aftercare following kidney transplant: Secondary | ICD-10-CM | POA: Diagnosis not present

## 2018-08-26 DIAGNOSIS — E875 Hyperkalemia: Secondary | ICD-10-CM | POA: Diagnosis not present

## 2018-08-26 DIAGNOSIS — T8619 Other complication of kidney transplant: Secondary | ICD-10-CM | POA: Diagnosis not present

## 2018-08-26 DIAGNOSIS — D72819 Decreased white blood cell count, unspecified: Secondary | ICD-10-CM | POA: Diagnosis not present

## 2018-08-26 DIAGNOSIS — B259 Cytomegaloviral disease, unspecified: Secondary | ICD-10-CM | POA: Diagnosis not present

## 2018-08-26 DIAGNOSIS — E785 Hyperlipidemia, unspecified: Secondary | ICD-10-CM | POA: Diagnosis not present

## 2018-08-26 DIAGNOSIS — M329 Systemic lupus erythematosus, unspecified: Secondary | ICD-10-CM | POA: Diagnosis not present

## 2018-08-26 DIAGNOSIS — B258 Other cytomegaloviral diseases: Secondary | ICD-10-CM | POA: Diagnosis not present

## 2018-08-26 DIAGNOSIS — E872 Acidosis: Secondary | ICD-10-CM | POA: Diagnosis not present

## 2018-08-26 DIAGNOSIS — M3214 Glomerular disease in systemic lupus erythematosus: Secondary | ICD-10-CM | POA: Diagnosis not present

## 2018-08-26 DIAGNOSIS — E8881 Metabolic syndrome: Secondary | ICD-10-CM | POA: Diagnosis not present

## 2018-08-26 DIAGNOSIS — I10 Essential (primary) hypertension: Secondary | ICD-10-CM | POA: Diagnosis not present

## 2018-08-26 DIAGNOSIS — R351 Nocturia: Secondary | ICD-10-CM | POA: Diagnosis not present

## 2018-08-26 DIAGNOSIS — D649 Anemia, unspecified: Secondary | ICD-10-CM | POA: Diagnosis not present

## 2018-08-26 DIAGNOSIS — Z7952 Long term (current) use of systemic steroids: Secondary | ICD-10-CM | POA: Diagnosis not present

## 2018-08-26 DIAGNOSIS — D8989 Other specified disorders involving the immune mechanism, not elsewhere classified: Secondary | ICD-10-CM | POA: Diagnosis not present

## 2018-08-26 DIAGNOSIS — Z79899 Other long term (current) drug therapy: Secondary | ICD-10-CM | POA: Diagnosis not present

## 2018-09-02 DIAGNOSIS — Z4822 Encounter for aftercare following kidney transplant: Secondary | ICD-10-CM | POA: Diagnosis not present

## 2018-09-03 DIAGNOSIS — Z4822 Encounter for aftercare following kidney transplant: Secondary | ICD-10-CM | POA: Diagnosis not present

## 2018-09-07 DIAGNOSIS — N529 Male erectile dysfunction, unspecified: Secondary | ICD-10-CM | POA: Diagnosis not present

## 2018-09-07 DIAGNOSIS — Z940 Kidney transplant status: Secondary | ICD-10-CM | POA: Diagnosis not present

## 2018-09-09 DIAGNOSIS — Z940 Kidney transplant status: Secondary | ICD-10-CM | POA: Diagnosis not present

## 2018-09-09 DIAGNOSIS — R7989 Other specified abnormal findings of blood chemistry: Secondary | ICD-10-CM | POA: Diagnosis not present

## 2018-09-09 DIAGNOSIS — Z4822 Encounter for aftercare following kidney transplant: Secondary | ICD-10-CM | POA: Diagnosis not present

## 2018-09-16 DIAGNOSIS — K641 Second degree hemorrhoids: Secondary | ICD-10-CM | POA: Diagnosis not present

## 2018-09-16 DIAGNOSIS — K602 Anal fissure, unspecified: Secondary | ICD-10-CM | POA: Diagnosis not present

## 2018-09-16 DIAGNOSIS — K625 Hemorrhage of anus and rectum: Secondary | ICD-10-CM | POA: Diagnosis not present

## 2018-09-16 DIAGNOSIS — K921 Melena: Secondary | ICD-10-CM | POA: Diagnosis not present

## 2018-09-17 DIAGNOSIS — R351 Nocturia: Secondary | ICD-10-CM | POA: Diagnosis not present

## 2018-09-17 DIAGNOSIS — Z7952 Long term (current) use of systemic steroids: Secondary | ICD-10-CM | POA: Diagnosis not present

## 2018-09-17 DIAGNOSIS — E8881 Metabolic syndrome: Secondary | ICD-10-CM | POA: Diagnosis not present

## 2018-09-17 DIAGNOSIS — Z4822 Encounter for aftercare following kidney transplant: Secondary | ICD-10-CM | POA: Diagnosis not present

## 2018-09-17 DIAGNOSIS — M329 Systemic lupus erythematosus, unspecified: Secondary | ICD-10-CM | POA: Diagnosis not present

## 2018-09-17 DIAGNOSIS — Z131 Encounter for screening for diabetes mellitus: Secondary | ICD-10-CM | POA: Diagnosis not present

## 2018-09-17 DIAGNOSIS — Z79899 Other long term (current) drug therapy: Secondary | ICD-10-CM | POA: Diagnosis not present

## 2018-09-17 DIAGNOSIS — E785 Hyperlipidemia, unspecified: Secondary | ICD-10-CM | POA: Diagnosis not present

## 2018-09-17 DIAGNOSIS — D8989 Other specified disorders involving the immune mechanism, not elsewhere classified: Secondary | ICD-10-CM | POA: Diagnosis not present

## 2018-09-17 DIAGNOSIS — E872 Acidosis: Secondary | ICD-10-CM | POA: Diagnosis not present

## 2018-09-17 DIAGNOSIS — I10 Essential (primary) hypertension: Secondary | ICD-10-CM | POA: Diagnosis not present

## 2018-09-17 DIAGNOSIS — D649 Anemia, unspecified: Secondary | ICD-10-CM | POA: Diagnosis not present

## 2018-09-17 DIAGNOSIS — D72819 Decreased white blood cell count, unspecified: Secondary | ICD-10-CM | POA: Diagnosis not present

## 2018-09-17 DIAGNOSIS — E875 Hyperkalemia: Secondary | ICD-10-CM | POA: Diagnosis not present

## 2018-09-17 DIAGNOSIS — B259 Cytomegaloviral disease, unspecified: Secondary | ICD-10-CM | POA: Diagnosis not present

## 2018-09-17 DIAGNOSIS — Z940 Kidney transplant status: Secondary | ICD-10-CM | POA: Diagnosis not present

## 2018-10-01 DIAGNOSIS — Z79899 Other long term (current) drug therapy: Secondary | ICD-10-CM | POA: Diagnosis not present

## 2018-10-01 DIAGNOSIS — Z7952 Long term (current) use of systemic steroids: Secondary | ICD-10-CM | POA: Diagnosis not present

## 2018-10-01 DIAGNOSIS — I10 Essential (primary) hypertension: Secondary | ICD-10-CM | POA: Diagnosis not present

## 2018-10-01 DIAGNOSIS — Z940 Kidney transplant status: Secondary | ICD-10-CM | POA: Diagnosis not present

## 2018-10-01 DIAGNOSIS — B258 Other cytomegaloviral diseases: Secondary | ICD-10-CM | POA: Diagnosis not present

## 2018-10-01 DIAGNOSIS — R351 Nocturia: Secondary | ICD-10-CM | POA: Diagnosis not present

## 2018-10-01 DIAGNOSIS — D8989 Other specified disorders involving the immune mechanism, not elsewhere classified: Secondary | ICD-10-CM | POA: Diagnosis not present

## 2018-10-01 DIAGNOSIS — Z4822 Encounter for aftercare following kidney transplant: Secondary | ICD-10-CM | POA: Diagnosis not present

## 2018-10-01 DIAGNOSIS — E875 Hyperkalemia: Secondary | ICD-10-CM | POA: Diagnosis not present

## 2018-10-01 DIAGNOSIS — B259 Cytomegaloviral disease, unspecified: Secondary | ICD-10-CM | POA: Diagnosis not present

## 2018-10-01 DIAGNOSIS — E872 Acidosis: Secondary | ICD-10-CM | POA: Diagnosis not present

## 2018-10-01 DIAGNOSIS — E8881 Metabolic syndrome: Secondary | ICD-10-CM | POA: Diagnosis not present

## 2018-10-01 DIAGNOSIS — E785 Hyperlipidemia, unspecified: Secondary | ICD-10-CM | POA: Diagnosis not present

## 2018-10-01 DIAGNOSIS — D649 Anemia, unspecified: Secondary | ICD-10-CM | POA: Diagnosis not present

## 2018-10-01 DIAGNOSIS — Z792 Long term (current) use of antibiotics: Secondary | ICD-10-CM | POA: Diagnosis not present

## 2018-10-01 DIAGNOSIS — D72819 Decreased white blood cell count, unspecified: Secondary | ICD-10-CM | POA: Diagnosis not present

## 2018-10-01 DIAGNOSIS — M329 Systemic lupus erythematosus, unspecified: Secondary | ICD-10-CM | POA: Diagnosis not present

## 2018-10-01 DIAGNOSIS — E119 Type 2 diabetes mellitus without complications: Secondary | ICD-10-CM | POA: Diagnosis not present

## 2018-10-12 DIAGNOSIS — Z4822 Encounter for aftercare following kidney transplant: Secondary | ICD-10-CM | POA: Diagnosis not present

## 2018-10-12 DIAGNOSIS — Z79899 Other long term (current) drug therapy: Secondary | ICD-10-CM | POA: Diagnosis not present

## 2018-10-21 DIAGNOSIS — Z4822 Encounter for aftercare following kidney transplant: Secondary | ICD-10-CM | POA: Diagnosis not present

## 2018-10-21 DIAGNOSIS — Z5181 Encounter for therapeutic drug level monitoring: Secondary | ICD-10-CM | POA: Diagnosis not present

## 2018-10-21 DIAGNOSIS — Z79899 Other long term (current) drug therapy: Secondary | ICD-10-CM | POA: Diagnosis not present
